# Patient Record
Sex: Female | Born: 1960 | Race: Black or African American | Hispanic: No | Marital: Married | Smoking: Never smoker
Health system: Southern US, Community
[De-identification: ages and names within clinical notes are randomized; demographics above are authoritative.]

## PROBLEM LIST (undated history)

## (undated) DIAGNOSIS — E559 Vitamin D deficiency, unspecified: Secondary | ICD-10-CM

## (undated) DIAGNOSIS — G459 Transient cerebral ischemic attack, unspecified: Secondary | ICD-10-CM

## (undated) DIAGNOSIS — D649 Anemia, unspecified: Secondary | ICD-10-CM

## (undated) DIAGNOSIS — E782 Mixed hyperlipidemia: Secondary | ICD-10-CM

## (undated) HISTORY — PX: OTHER SURGICAL HISTORY: SHX169

## (undated) HISTORY — DX: Anemia, unspecified: D64.9

## (undated) HISTORY — DX: Mixed hyperlipidemia: E78.2

## (undated) HISTORY — DX: Vitamin D deficiency, unspecified: E55.9

## (undated) HISTORY — DX: Transient cerebral ischemic attack, unspecified: G45.9

---

## 2001-04-21 ENCOUNTER — Emergency Department (HOSPITAL_COMMUNITY): Admission: EM | Admit: 2001-04-21 | Discharge: 2001-04-22 | Payer: Self-pay | Admitting: *Deleted

## 2001-04-22 ENCOUNTER — Encounter: Payer: Self-pay | Admitting: Emergency Medicine

## 2012-07-17 ENCOUNTER — Other Ambulatory Visit (HOSPITAL_COMMUNITY)
Admission: RE | Admit: 2012-07-17 | Discharge: 2012-07-17 | Disposition: A | Discharge status: Home / Self Care | Payer: Managed Care, Other (non HMO) | Source: Ambulatory Visit | Attending: Family Medicine | Admitting: Family Medicine

## 2012-07-17 DIAGNOSIS — Z0000 Encounter for general adult medical examination without abnormal findings: Secondary | ICD-10-CM | POA: Insufficient documentation

## 2013-05-30 ENCOUNTER — Encounter (HOSPITAL_COMMUNITY): Payer: Self-pay | Admitting: Emergency Medicine

## 2013-05-30 ENCOUNTER — Inpatient Hospital Stay (HOSPITAL_COMMUNITY)
Admission: EM | Admit: 2013-05-30 | Discharge: 2013-06-01 | DRG: 069 | Disposition: A | Discharge status: Home / Self Care | Payer: Managed Care, Other (non HMO) | Attending: Internal Medicine | Admitting: Internal Medicine

## 2013-05-30 ENCOUNTER — Emergency Department (HOSPITAL_COMMUNITY): Payer: Managed Care, Other (non HMO)

## 2013-05-30 DIAGNOSIS — E7800 Pure hypercholesterolemia, unspecified: Secondary | ICD-10-CM | POA: Diagnosis present

## 2013-05-30 DIAGNOSIS — G459 Transient cerebral ischemic attack, unspecified: Principal | ICD-10-CM | POA: Diagnosis present

## 2013-05-30 DIAGNOSIS — I671 Cerebral aneurysm, nonruptured: Secondary | ICD-10-CM | POA: Diagnosis present

## 2013-05-30 LAB — CBC WITH DIFFERENTIAL/PLATELET
Basophils Absolute: 0 10*3/uL (ref 0.0–0.1)
Basophils Relative: 0 % (ref 0–1)
Eosinophils Absolute: 0 10*3/uL (ref 0.0–0.7)
Eosinophils Relative: 0 % (ref 0–5)
HCT: 38.6 % (ref 36.0–46.0)
Hemoglobin: 13.5 g/dL (ref 12.0–15.0)
Lymphocytes Relative: 28 % (ref 12–46)
Lymphs Abs: 1.9 10*3/uL (ref 0.7–4.0)
MCH: 31.1 pg (ref 26.0–34.0)
MCHC: 35 g/dL (ref 30.0–36.0)
MCV: 88.9 fL (ref 78.0–100.0)
Monocytes Absolute: 0.3 10*3/uL (ref 0.1–1.0)
Monocytes Relative: 4 % (ref 3–12)
Neutro Abs: 4.5 10*3/uL (ref 1.7–7.7)
Neutrophils Relative %: 68 % (ref 43–77)
Platelets: 307 10*3/uL (ref 150–400)
RBC: 4.34 MIL/uL (ref 3.87–5.11)
RDW: 12.5 % (ref 11.5–15.5)
WBC: 6.7 10*3/uL (ref 4.0–10.5)

## 2013-05-30 LAB — BASIC METABOLIC PANEL
BUN: 6 mg/dL (ref 6–23)
CO2: 29 mEq/L (ref 19–32)
Calcium: 9.6 mg/dL (ref 8.4–10.5)
Chloride: 101 mEq/L (ref 96–112)
Creatinine, Ser: 0.99 mg/dL (ref 0.50–1.10)
GFR calc Af Amer: 76 mL/min — ABNORMAL LOW (ref >90)
GFR calc non Af Amer: 65 mL/min — ABNORMAL LOW (ref >90)
Glucose, Bld: 98 mg/dL (ref 70–99)
Potassium: 4 mEq/L (ref 3.5–5.1)
Sodium: 138 mEq/L (ref 135–145)

## 2013-05-30 MED ORDER — SODIUM CHLORIDE 0.9 % IV SOLN
INTRAVENOUS | Status: AC
  Administered 2013-05-30: 1000 mL via INTRAVENOUS

## 2013-05-30 NOTE — ED Notes (Signed)
Triage note from 1540 on wrong patient.

## 2013-05-30 NOTE — H&P (Addendum)
[facility] History and Physical  Judy Hudson ZOX:[id number] DOB: 1961-07-10 DOA: 05/30/2013  Referring physician: Dr Adriana Simas (ED) PCP: Leota Sauers, RN  Specialists: neurology (Dr Thana Farr)  Chief Complaint:Lt facial tingling/Tingling Lt arm   HPI: 52 y.o. BF negative past medical history states went to bed last night at 2300 and felt "normal" and then awoke at 0600 on 05/30/2013 with left facial tingling. As the day progressed she felt heaviness and tingling in her left arm as well. Her upper extremity symptoms only lasted for about an hour but her face continued to tingle. . She called her primary MD at [facility] (Dr Elsie Saas) who saw her at 10 and then directed her to ED. states upon arrival at ED symptoms have resolved. Patient was evaluated by neurology (Dr Thana Farr) and Dr. Haywood Filler physician) for patient should be admitted for TIA versus CVA. Currently patient has zero neurological deficits. Patient has good family support with numerous family members at patient's side.  Review of Systems: The patient denies anorexia, fever, weight loss,, vision loss, decreased hearing, hoarseness, chest pain, syncope, dyspnea on exertion, peripheral edema, balance deficits, hemoptysis, abdominal pain, melena, hematochezia, severe indigestion/heartburn, hematuria, incontinence, genital sores, muscle weakness, suspicious skin lesions, transient blindness, difficulty walking, depression, unusual weight change, abnormal bleeding, enlarged lymph nodes, angioedema, and breast masses.    History reviewed. No pertinent past medical history.  Social History:  reports that she does not drink alcohol or use illicit drugs. Negative previous tobacco history. .  where does patient live; at home with husband Patient Can patient participate in ADLs?  Allergies  Allergen Reactions  . Codeine Nausea And Vomiting  . Ibuprofen Other (See Comments)    "turns her stomach"    No family history on  file.  Mother passed away from pancreatic cancer. Dad and 4x siblings HTN, Dad HLD      Prior to Admission medications   Not on File   Physical Exam: Filed Vitals:   05/30/13 1900 05/30/13 1915 05/30/13 1930 05/30/13 1945  BP: 123/79 130/87 134/71 113/63  Pulse: 54 66 56 60  Temp:      TempSrc:      Resp:      SpO2: 100% 100% 99% 100%     General: Alert and oriented x4  Eyes: Pupils are equal bilaterally and respond appropriately to light.  Cardiovascular: RRR, (-) M/R/G, DP/PT pulse +2  Respiratory:CTA Bilat Neurologic: Cranial nerves II through XII are intact. NOTE; patient passed a swallow test in ED. Patient's sensation is normal in both upper trapezius and lower extremities 2 point discrimination, pain. Negative Romberg's. Strength in upper and lower extremity 5/5. Patellar reflexes 2+. Patient's gait is normal on toe walk, Heel walk, balancing on one leg within normal limits.  Labs on Admission:  Basic Metabolic Panel:  Recent Labs Lab 05/30/13 1233  NA 138  K 4.0  CL 101  CO2 29  GLUCOSE 98  BUN 6  CREATININE 0.99  CALCIUM 9.6   Liver Function Tests: No results found for this basename: AST, ALT, ALKPHOS, BILITOT, PROT, ALBUMIN,  in the last 168 hours No results found for this basename: LIPASE, AMYLASE,  in the last 168 hours No results found for this basename: AMMONIA,  in the last 168 hours CBC:  Recent Labs Lab 05/30/13 1233  WBC 6.7  NEUTROABS 4.5  HGB 13.5  HCT 38.6  MCV 88.9  PLT 307   Cardiac Enzymes: No results found for this basename: CKTOTAL, CKMB, CKMBINDEX,  TROPONINI,  in the last 168 hours  BNP (last 3 results) No results found for this basename: PROBNP,  in the last 8760 hours CBG: No results found for this basename: GLUCAP,  in the last 168 hours  Radiological Exams on Admission: Ct Head Wo Contrast  05/30/2013   *RADIOLOGY REPORT*  Clinical Data: Left facial numbness.  CT HEAD WITHOUT CONTRAST  Technique:  Contiguous axial  images were obtained from the base of the skull through the vertex without contrast.  Comparison: None.  Findings: The brain appears normal without infarct, hemorrhage, mass lesion, mass effect, midline shift or abnormal extra-axial fluid collection.  No hydrocephalus or pneumocephalus.  The calvarium is intact.  IMPRESSION: Negative exam.   Original Report Authenticated By: Holley Dexter, M.D.    EKG: Independently reviewed. Read Pending Assessment/Plan Active Problems:   TIA   1. TIA; ABCD2 Score=2; counsel patient and family given her symptoms the most likely etiology of the symptoms was a TIA. However we would complete the TIA protocol over the next 24 hours to assure that she did not have a small CVA (will obtain MRA, MRI of brain without contrast, cardiac echo, carotid Dopplers, evaluate for hyperlipidemia, diabetes). Per neurology's note Will start patient on aspirin 81 mg. 2. DVT prophylaxis; patient encouraged to ambulate as tolerated, and use SCDs when in the bed until cleared by MRI.   Code Status: Full  Family Communication: Present during exam were patient's husband, sisters, father.  Disposition Plan: At multidisciplinary rounds on 19 June will begin discharge planning expecting that patient's studies will all be negative.  Time spent 60 minutes  Drema Dallas [facility] Pager [phone]

## 2013-05-30 NOTE — Consult Note (Addendum)
Referring Physician: Dr. Joseph Art    Chief Complaint: left face tingling  HPI: Judy Hudson is an 52 y.o. female who went to bed last night at 2300 and felt "normal" and then awoke at 0600 on 05/30/2013 with left facial tingling. As the day progressed she felt heaviness and tingling in her left arm as well.  Her upper extremity symptoms only lasted for about an hour but her face has continued to tingle.  .  She called her primary MD at [facility] who saw her at 2 and then directed her to ED.  See's MD yearly. Healthy. Exercises. No history of clotting disorder. Post-menopausal but not on HRT.  Date last known well: Date: 05/29/2013 Time last known well: Time: 23:00  tPA Given: No: out of window, symptoms resolving  PMH: post menopausal x 2 years (no HRT) No pertinent past surgical history.  FH: Mother passed away from pancreatic cancer.  Her father and siblings have hypertension  Social History:  Denies tobacco, alcohol or illicit drug use.  Allergies:  Allergies  Allergen Reactions  . Codeine Nausea And Vomiting  . Ibuprofen Other (See Comments)    "turns her stomach"   Medications: None  ROS: History obtained from spouse and chart review  General ROS: negative for - chills, fatigue, fever, weight gain or weight loss,  +night sweats she attributes to menopause Psychological ROS: negative for - behavioral disorder, hallucinations, memory difficulties, mood swings or suicidal ideation Ophthalmic ROS: negative for - blurry vision, double vision, eye pain or loss of vision ENT ROS: negative for - epistaxis, nasal discharge, oral lesions, sore throat, tinnitus or vertigo Allergy and Immunology ROS: negative for - hives or itchy/watery eyes Hematological and Lymphatic ROS: negative for - bleeding problems, bruising or swollen lymph nodes Endocrine ROS: negative for - galactorrhea, hair pattern changes, polydipsia/polyuria or temperature intolerance Respiratory ROS: negative for - cough,  hemoptysis, shortness of breath or wheezing Cardiovascular ROS: negative for - chest pain, dyspnea on exertion, edema or irregular heartbeat Gastrointestinal ROS: negative for - abdominal pain, diarrhea, hematemesis, nausea/vomiting or stool incontinence Genito-Urinary ROS: negative for - dysuria, hematuria, incontinence or urinary frequency/urgency Musculoskeletal ROS: negative for - joint swelling or muscular weakness Neurological ROS: as noted in HPI Dermatological ROS: negative for rash and skin lesion changes   Physical Examination: Blood pressure 127/74, pulse 63, temperature 97.8 F (36.6 C), temperature source Oral, resp. rate 16, SpO2 98.00%.  Neurologic Examination: Mental Status: Alert, oriented, thought content appropriate.  Speech fluent without evidence of aphasia.  Able to follow 3 step commands without difficulty. Cranial Nerves: II: Discs flat bilaterally; visual fields grossly normal, pupils equal, round, reactive to light and accommodation III,IV, VI: ptosis not present, extra-ocular motions intact bilaterally V,VII: smile symmetric, facial light touch sensation normal bilaterally despite continuing to feel a tingling sensation on left cheek VIII: hearing normal bilaterally IX,X: gag reflex present XI: trapezius strength/neck flexion strength normal bilaterally XII: tongue strength normal  Motor: Right : Upper extremity   5/5    Left:     Upper extremity   5/5  Lower extremity   5/5     Lower extremity   5/5 Tone and bulk:normal tone throughout; no atrophy noted Sensory: Pinprick and light touch intact throughout, bilaterally Deep Tendon Reflexes: 2+ and symmetric with absent AJ's bilaterally Plantars: Right: downgoing   Left: downgoing Cerebellar: normal finger-to-nose, normal heel-to-shin test CV: Pulses palpable throughout  Results for orders placed during the hospital encounter of 05/30/13 (from the past  48 hour(s))  BASIC METABOLIC PANEL     Status:  Abnormal   Collection Time    05/30/13 12:33 PM      Result Value Range   Sodium 138  135 - 145 mEq/L   Potassium 4.0  3.5 - 5.1 mEq/L   Chloride 101  96 - 112 mEq/L   CO2 29  19 - 32 mEq/L   Glucose, Bld 98  70 - 99 mg/dL   BUN 6  6 - 23 mg/dL   Creatinine, Ser 1.61  0.50 - 1.10 mg/dL   Calcium 9.6  8.4 - 09.6 mg/dL   GFR calc non Af Amer 65 (*) >90 mL/min   GFR calc Af Amer 76 (*) >90 mL/min   Comment:            The eGFR has been calculated     using the CKD EPI equation.     This calculation has not been     validated in all clinical     situations.     eGFR's persistently     <90 mL/min signify     possible Chronic Kidney Disease.  CBC WITH DIFFERENTIAL     Status: None   Collection Time    05/30/13 12:33 PM      Result Value Range   WBC 6.7  4.0 - 10.5 K/uL   RBC 4.34  3.87 - 5.11 MIL/uL   Hemoglobin 13.5  12.0 - 15.0 g/dL   HCT 04.5  40.9 - 81.1 %   MCV 88.9  78.0 - 100.0 fL   MCH 31.1  26.0 - 34.0 pg   MCHC 35.0  30.0 - 36.0 g/dL   RDW 91.4  78.2 - 95.6 %   Platelets 307  150 - 400 K/uL   Neutrophils Relative % 68  43 - 77 %   Neutro Abs 4.5  1.7 - 7.7 K/uL   Lymphocytes Relative 28  12 - 46 %   Lymphs Abs 1.9  0.7 - 4.0 K/uL   Monocytes Relative 4  3 - 12 %   Monocytes Absolute 0.3  0.1 - 1.0 K/uL   Eosinophils Relative 0  0 - 5 %   Eosinophils Absolute 0.0  0.0 - 0.7 K/uL   Basophils Relative 0  0 - 1 %   Basophils Absolute 0.0  0.0 - 0.1 K/uL   Ct Head Wo Contrast  05/30/2013   *RADIOLOGY REPORT*  Clinical Data: Left facial numbness.  CT HEAD WITHOUT CONTRAST  Technique:  Contiguous axial images were obtained from the base of the skull through the vertex without contrast.  Comparison: None.  Findings: The brain appears normal without infarct, hemorrhage, mass lesion, mass effect, midline shift or abnormal extra-axial fluid collection.  No hydrocephalus or pneumocephalus.  The calvarium is intact.  IMPRESSION: Negative exam.   Original Report Authenticated  By: Holley Dexter, M.D.    Job Founds, MBA, MHA [facility] Pager [phone]  Patient seen and examined.  Clinical course and management discussed.  Necessary edits performed.  I agree with the above.  Assessment and plan of care developed and discussed below.    Assessment: 52 year old female with minimal risk factors who presents with left sided complaints.  They have improved to the point that only the left face is involved.  There is no facial droop, visual complaint or change in taste or swallowing.  Head CT has been performed and reviewed.  It shows no acute changes.  Further work up recommended.  Plan: 1. HgbA1c, fasting lipid panel 2. MRI, MRA  of the brain without contrast 3. Echocardiogram 4. Carotid dopplers 5. Prophylactic therapy-Antiplatelet med: Aspirin - dose 81mg  daily 6. Risk factor modification 7. Telemetry monitoring 8. Frequent neuro checks  Thana Farr, MD [facility] [phone]  05/30/2013  6:32 PM

## 2013-05-30 NOTE — ED Notes (Signed)
Pt. Stated, I woke up this morning with my face tingling and my left arm feels weak.  Went to Dr Lorin Picket and told to come here for a CT scan.  Pt.has bilateral = grips, smile was symmetrical. Pt. Went to bed and was ok, and I woke up and this is what was going on.Marland Kitchen

## 2013-05-30 NOTE — ED Provider Notes (Signed)
History     CSN: [id number]  Arrival date & time 05/30/13  1128   First MD Initiated Contact with Patient 05/30/13 1147      Chief Complaint  Patient presents with  . Weakness    (Consider location/radiation/quality/duration/timing/severity/associated sxs/prior treatment) HPI.... left arm and left cheek numbness upon awakening this morning. Patient was normal when she went to bed last night. No previous neurological events. Patient is normally healthy. She takes no medications at home. No smoking.  Nothing makes symptoms better or worse. Severity is mild.  History reviewed. No pertinent past medical history.  History reviewed. No pertinent past surgical history.  No family history on file.  History  Substance Use Topics  . Smoking status: Not on file  . Smokeless tobacco: Not on file  . Alcohol Use: No    OB History   Grav Para Term Preterm Abortions TAB SAB Ect Mult Living                  Review of Systems  All other systems reviewed and are negative.    Allergies  Codeine and Ibuprofen  Home Medications  No current outpatient prescriptions on file.  BP 135/77  Pulse 67  Temp(Src) 97.8 F (36.6 C) (Oral)  Resp 16  SpO2 100%  Physical Exam  Nursing note and vitals reviewed. Constitutional: She is oriented to person, place, and time. She appears well-developed and well-nourished.  HENT:  Head: Normocephalic and atraumatic.  Eyes: Conjunctivae and EOM are normal. Pupils are equal, round, and reactive to light.  Neck: Normal range of motion. Neck supple.  Cardiovascular: Normal rate, regular rhythm and normal heart sounds.   Pulmonary/Chest: Effort normal and breath sounds normal.  Abdominal: Soft. Bowel sounds are normal.  Musculoskeletal: Normal range of motion.  Neurological: She is alert and oriented to person, place, and time.  Full range of motion of left arm.  Skin: Skin is warm and dry.  Psychiatric: She has a normal mood and affect.    ED  Course  Procedures (including critical care time)  Labs Reviewed  BASIC METABOLIC PANEL - Abnormal; Notable for the following:    GFR calc non Af Amer 65 (*)    GFR calc Af Amer 76 (*)    All other components within normal limits  CBC WITH DIFFERENTIAL   No results found. Ct Head Wo Contrast  05/30/2013   *RADIOLOGY REPORT*  Clinical Data: Left facial numbness.  CT HEAD WITHOUT CONTRAST  Technique:  Contiguous axial images were obtained from the base of the skull through the vertex without contrast.  Comparison: None.  Findings: The brain appears normal without infarct, hemorrhage, mass lesion, mass effect, midline shift or abnormal extra-axial fluid collection.  No hydrocephalus or pneumocephalus.  The calvarium is intact.  IMPRESSION: Negative exam.   Original Report Authenticated By: Holley Dexter, M.D.    No diagnosis found.   Date: 05/30/2013  Rate: 5  Rhythm: Sinus bradycardia  QRS Axis: normal  Intervals: normal  ST/T Wave abnormalities: normal  Conduction Disutrbances: none  Narrative Interpretation: unremarkable     MDM  CT head normal.  This could represent a TIA. Discussed with neurology. Admit to general medicine.        Donnetta Hutching, MD 05/30/13 [phone]

## 2013-05-31 ENCOUNTER — Inpatient Hospital Stay (HOSPITAL_COMMUNITY): Payer: Managed Care, Other (non HMO)

## 2013-05-31 ENCOUNTER — Encounter (HOSPITAL_COMMUNITY): Payer: Self-pay | Admitting: General Practice

## 2013-05-31 DIAGNOSIS — G459 Transient cerebral ischemic attack, unspecified: Secondary | ICD-10-CM

## 2013-05-31 DIAGNOSIS — I671 Cerebral aneurysm, nonruptured: Secondary | ICD-10-CM | POA: Diagnosis present

## 2013-05-31 LAB — RAPID URINE DRUG SCREEN, HOSP PERFORMED
Amphetamines: NOT DETECTED
Barbiturates: NOT DETECTED
Benzodiazepines: NOT DETECTED
Cocaine: NOT DETECTED
Tetrahydrocannabinol: NOT DETECTED

## 2013-05-31 LAB — GLUCOSE, CAPILLARY: Glucose-Capillary: 86 mg/dL (ref 70–99)

## 2013-05-31 LAB — LIPID PANEL
Cholesterol: 204 mg/dL — ABNORMAL HIGH (ref 0–200)
HDL: 77 mg/dL (ref >39)
LDL Cholesterol: 114 mg/dL — ABNORMAL HIGH (ref 0–99)
Total CHOL/HDL Ratio: 2.6 RATIO
Triglycerides: 65 mg/dL (ref <150)
VLDL: 13 mg/dL (ref 0–40)

## 2013-05-31 MED ORDER — IOHEXOL 350 MG/ML SOLN
50.0000 mL | Freq: Once | INTRAVENOUS | Status: AC | PRN
  Administered 2013-05-31: 50 mL via INTRAVENOUS

## 2013-05-31 NOTE — Progress Notes (Signed)
*  PRELIMINARY RESULTS* Vascular Ultrasound Carotid Duplex (Doppler) has been completed.  Preliminary findings: Bilateral:  Less than 39% ICA stenosis.  Vertebral artery flow is antegrade.    Farrel Demark, RDMS, RVT  05/31/2013, 10:53 AM

## 2013-05-31 NOTE — Plan of Care (Signed)
Dr Joseph Art paged in regards to pts discharge. Waiting for md to call back.

## 2013-05-31 NOTE — Progress Notes (Signed)
Echo Lab  2D Echocardiogram completed.  Judy Hudson, RDCS 05/31/2013 11:06 AM

## 2013-05-31 NOTE — Progress Notes (Addendum)
Subjective: Patient reports that she awakened this morning and all of her symptoms had resolved.  She feels that she is at baseline.  MRI of the brain has been reviewed and shows no acute infarct.  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  Do not feel that the aneurysm contributed to the patient's symptoms.  Echo preformed and shows no evidence of intracardiac masses or thrombi.    Objective: Current vital signs: BP 110/66  Pulse 54  Temp(Src) 97.9 F (36.6 C) (Oral)  Resp 20  Ht 4\' 11"  (1.499 m)  Wt 63.6 kg (140 lb 3.4 oz)  BMI 28.3 kg/m2  SpO2 97% Vital signs in last 24 hours: Temp:  [97.9 F (36.6 C)-98 F (36.7 C)] 97.9 F (36.6 C) (06/19 0629) Pulse Rate:  [54-136] 54 (06/19 0629) Resp:  [19-20] 20 (06/19 0629) BP: (97-134)/(44-89) 110/66 mmHg (06/19 0629) SpO2:  [97 %-100 %] 97 % (06/19 0629) Weight:  [63.6 kg (140 lb 3.4 oz)] 63.6 kg (140 lb 3.4 oz) (06/18 2120)  Intake/Output from previous day:   Intake/Output this shift: Total I/O In: 240 [P.O.:240] Out: -  Nutritional status: Cardiac  Neurologic Exam: Mental Status:  Alert, oriented, thought content appropriate. Speech fluent without evidence of aphasia. Able to follow 3 step commands without difficulty.  Cranial Nerves:  II: Discs flat bilaterally; visual fields grossly normal, pupils equal, round, reactive to light and accommodation  III,IV, VI: ptosis not present, extra-ocular motions intact bilaterally  V,VII: smile symmetric, facial light touch sensation intact bilaterally VIII: hearing normal bilaterally  IX,X: gag reflex present  XI: trapezius strength/neck flexion strength normal bilaterally  XII: tongue strength normal  Motor:  Right : Upper extremity 5/5          Left: Upper extremity 5/5   Lower extremity 5/5       Lower extremity 5/5  Tone and bulk:normal tone throughout; no atrophy noted  Sensory: Pinprick and light touch intact throughout, bilaterally  Deep Tendon Reflexes: 2+ and symmetric with  absent AJ's bilaterally  Plantars:  Right: downgoing   Left: downgoing   Lab Results: Basic Metabolic Panel:  Recent Labs Lab 05/30/13 1233  NA 138  K 4.0  CL 101  CO2 29  GLUCOSE 98  BUN 6  CREATININE 0.99  CALCIUM 9.6    Liver Function Tests: No results found for this basename: AST, ALT, ALKPHOS, BILITOT, PROT, ALBUMIN,  in the last 168 hours No results found for this basename: LIPASE, AMYLASE,  in the last 168 hours No results found for this basename: AMMONIA,  in the last 168 hours  CBC:  Recent Labs Lab 05/30/13 1233  WBC 6.7  NEUTROABS 4.5  HGB 13.5  HCT 38.6  MCV 88.9  PLT 307    Cardiac Enzymes: No results found for this basename: CKTOTAL, CKMB, CKMBINDEX, TROPONINI,  in the last 168 hours  Lipid Panel:  Recent Labs Lab 05/31/13 0525  CHOL 204*  TRIG 65  HDL 77  CHOLHDL 2.6  VLDL 13  LDLCALC 696*    CBG:  Recent Labs Lab 05/31/13 0149 05/31/13 0630  GLUCAP 86 87    Microbiology: No results found for this or any previous visit.  Coagulation Studies: No results found for this basename: LABPROT, INR,  in the last 72 hours  Imaging: Ct Head Wo Contrast  05/30/2013   *RADIOLOGY REPORT*  Clinical Data: Left facial numbness.  CT HEAD WITHOUT CONTRAST  Technique:  Contiguous axial images were obtained from the  base of the skull through the vertex without contrast.  Comparison: None.  Findings: The brain appears normal without infarct, hemorrhage, mass lesion, mass effect, midline shift or abnormal extra-axial fluid collection.  No hydrocephalus or pneumocephalus.  The calvarium is intact.  IMPRESSION: Negative exam.   Original Report Authenticated By: Holley Dexter, M.D.   Mri Brain Without Contrast  05/31/2013   *RADIOLOGY REPORT*  Clinical Data:  Left facial tingling.  Heaviness and tingling left arm.  Left arm symptoms have cleared.  MRI BRAIN WITHOUT CONTRAST MRA HEAD WITHOUT CONTRAST  Technique: Multiplanar, multiecho pulse sequences  of the brain and surrounding structures were obtained according to standard protocol without intravenous contrast.  Angiographic images of the head were obtained using MRA technique without contrast.  Comparison: 05/30/2013 CT.  No comparison MR.  MRI HEAD  Findings:  No acute infarct.  Few scattered punctate nonspecific white matter type changes greater on the left.  Similar type findings described in patients with migraine headaches.  Other considerations include that secondary to; small vessel disease, vasculitis, inflammatory process or prior trauma.  Appearance is not typical for result of demyelinating process.  No hydrocephalus.  No intracranial hemorrhage.  Major intracranial vascular structures are patent.  No intracranial mass lesion detected on this unenhanced exam.  Mild cervical spondylotic changes C3-4 and C4-5 incompletely assessed on the present examination.  Cervical medullary junction, pituitary region, pineal region and orbital structures unremarkable.  IMPRESSION: No acute infarct.  Minimal nonspecific white matter type changes as noted above.  MRA HEAD  Findings: Question 2.2 mm aneurysm M1 segment left middle cerebral artery.  This may represent origin of a vessel.  Anterior circulation without medium or large size vessel significant stenosis or occlusion.  Ectatic vertebral arteries and the basilar artery.  Left vertebral artery slightly dominant in size.  No high-grade stenosis of the vertebral arteries or basilar artery.  Poor delineation of the AICAs.  Mild irregularity of the superior cerebellar artery and distal branches of the posterior cerebral artery bilaterally.  IMPRESSION: Question 2.2 mm left middle cerebral artery M1 segment aneurysm as noted above.   Original Report Authenticated By: Lacy Duverney, M.D.   Mr Mra Head/brain Wo Cm  05/31/2013   *RADIOLOGY REPORT*  Clinical Data:  Left facial tingling.  Heaviness and tingling left arm.  Left arm symptoms have cleared.  MRI BRAIN  WITHOUT CONTRAST MRA HEAD WITHOUT CONTRAST  Technique: Multiplanar, multiecho pulse sequences of the brain and surrounding structures were obtained according to standard protocol without intravenous contrast.  Angiographic images of the head were obtained using MRA technique without contrast.  Comparison: 05/30/2013 CT.  No comparison MR.  MRI HEAD  Findings:  No acute infarct.  Few scattered punctate nonspecific white matter type changes greater on the left.  Similar type findings described in patients with migraine headaches.  Other considerations include that secondary to; small vessel disease, vasculitis, inflammatory process or prior trauma.  Appearance is not typical for result of demyelinating process.  No hydrocephalus.  No intracranial hemorrhage.  Major intracranial vascular structures are patent.  No intracranial mass lesion detected on this unenhanced exam.  Mild cervical spondylotic changes C3-4 and C4-5 incompletely assessed on the present examination.  Cervical medullary junction, pituitary region, pineal region and orbital structures unremarkable.  IMPRESSION: No acute infarct.  Minimal nonspecific white matter type changes as noted above.  MRA HEAD  Findings: Question 2.2 mm aneurysm M1 segment left middle cerebral artery.  This may represent origin of a  vessel.  Anterior circulation without medium or large size vessel significant stenosis or occlusion.  Ectatic vertebral arteries and the basilar artery.  Left vertebral artery slightly dominant in size.  No high-grade stenosis of the vertebral arteries or basilar artery.  Poor delineation of the AICAs.  Mild irregularity of the superior cerebellar artery and distal branches of the posterior cerebral artery bilaterally.  IMPRESSION: Question 2.2 mm left middle cerebral artery M1 segment aneurysm as noted above.   Original Report Authenticated By: Lacy Duverney, M.D.    Medications:  None   Assessment/Plan: 53 year old female with onset of left  sided symptoms.  Symptoms have resolved.  MRI unremarkable.  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  Echocardiogram unremarkable.  A1c 5.6, LDL114.  Recommendations: 1.  ASA 81mg  daily 2.  Lipid management 3.  Aneurysm to be followed up on an outpatient basis 4.  No further neurologic intervention is recommended at this time.  If further questions arise, please call or page at that time.  Thank you for allowing neurology to participate in the care of this patient.    LOS: 1 day   Thana Farr, MD [facility] [phone] 05/31/2013  12:51 PM

## 2013-05-31 NOTE — Progress Notes (Signed)
Utilization review completed. Ayslin Kundert, RN, BSN. 

## 2013-05-31 NOTE — Progress Notes (Signed)
[facility] PROGRESS NOTE  Judy Hudson UJW:[id number] DOB: 04-Nov-1961 DOA: 05/30/2013 PCP: Leota Sauers, RN  Assessment/Plan: 1. TIA; all SSx resolved 2,   Aneurysm left middle cerebral artery; during TIA workup MRA showed questionable 2.2 mm aneurysm M1 segment left middle cerebral artery. Discussed findings with Dr. Constance Goltz (radiologist) decided on brain CTA as definitive test to rule out/rule in aneurysm. Plan discussed with patient husband, and son.   TIA (transient ischemic attack) Aneurysm left middle cerebral artery  Code Status:Full Family Communication: Discussed treatment plan with patient's husband and son Disposition Plan: Will potentially discharge following CTA. All TIA signs and symptoms resolved  Consultants:  Neurology  Procedures:, Brain MRI/MRA, cardiac echo, carotid Dopplers Preliminary findings: Bilateral: Less than 39% ICA stenosis. Vertebral artery flow is antegrade, brain CTA pending  Antibiotics:  None  HPI/Subjective: 52 y.o. BF negative past medical history states went to bed last night at 2300 and felt "normal" and then awoke at 0600 on 05/30/2013 with left facial tingling. As the day progressed she felt heaviness and tingling in her left arm as well. Her upper extremity symptoms only lasted for about an hour but her face continued to tingle.  She called her primary MD at [facility] (Dr Elsie Saas) who saw her at 70 and then directed her to ED. states upon arrival at ED symptoms have resolved. Patient was evaluated by neurology (Dr Thana Farr) and Dr. Haywood Filler physician) for patient should be admitted for TIA versus CVA. TODAYzero neurological deficits. Patient and family anxious for discharge.  Objective: Filed Vitals:   05/31/13 0143 05/31/13 0149 05/31/13 0629 05/31/13 1300  BP: 105/58 110/74 110/66 117/70  Pulse: 63  54 55  Temp: 97.9 F (36.6 C)  97.9 F (36.6 C) 97.6 F (36.4 C)  TempSrc: Oral  Oral Oral  Resp: 19  20 20   Height:       Weight:      SpO2: 97%  97% 96%    Intake/Output Summary (Last 24 hours) at 05/31/13 1643 Last data filed at 05/31/13 1300  Gross per 24 hour  Intake    440 ml  Output      1 ml  Net    439 ml   Filed Weights   05/30/13 2120  Weight: 63.6 kg (140 lb 3.4 oz)    Exam:  General: Alert and oriented x4  Eyes: Pupils are equal bilaterally and respond appropriately to light.  Cardiovascular: RRR, (-) M/R/G, DP/PT pulse +2  Respiratory:CTA Bilat     Data Reviewed: Basic Metabolic Panel:  Recent Labs Lab 05/30/13 1233  NA 138  K 4.0  CL 101  CO2 29  GLUCOSE 98  BUN 6  CREATININE 0.99  CALCIUM 9.6   Liver Function Tests: No results found for this basename: AST, ALT, ALKPHOS, BILITOT, PROT, ALBUMIN,  in the last 168 hours No results found for this basename: LIPASE, AMYLASE,  in the last 168 hours No results found for this basename: AMMONIA,  in the last 168 hours CBC:  Recent Labs Lab 05/30/13 1233  WBC 6.7  NEUTROABS 4.5  HGB 13.5  HCT 38.6  MCV 88.9  PLT 307   Cardiac Enzymes: No results found for this basename: CKTOTAL, CKMB, CKMBINDEX, TROPONINI,  in the last 168 hours BNP (last 3 results) No results found for this basename: PROBNP,  in the last 8760 hours CBG:  Recent Labs Lab 05/31/13 0149 05/31/13 0630  GLUCAP 86 87    No results found for this or  any previous visit (from the past 240 hour(s)).   Studies: Ct Head Wo Contrast  05/30/2013   *RADIOLOGY REPORT*  Clinical Data: Left facial numbness.  CT HEAD WITHOUT CONTRAST  Technique:  Contiguous axial images were obtained from the base of the skull through the vertex without contrast.  Comparison: None.  Findings: The brain appears normal without infarct, hemorrhage, mass lesion, mass effect, midline shift or abnormal extra-axial fluid collection.  No hydrocephalus or pneumocephalus.  The calvarium is intact.  IMPRESSION: Negative exam.   Original Report Authenticated By: Holley Dexter, M.D.    Mri Brain Without Contrast  05/31/2013   *RADIOLOGY REPORT*  Clinical Data:  Left facial tingling.  Heaviness and tingling left arm.  Left arm symptoms have cleared.  MRI BRAIN WITHOUT CONTRAST MRA HEAD WITHOUT CONTRAST  Technique: Multiplanar, multiecho pulse sequences of the brain and surrounding structures were obtained according to standard protocol without intravenous contrast.  Angiographic images of the head were obtained using MRA technique without contrast.  Comparison: 05/30/2013 CT.  No comparison MR.  MRI HEAD  Findings:  No acute infarct.  Few scattered punctate nonspecific white matter type changes greater on the left.  Similar type findings described in patients with migraine headaches.  Other considerations include that secondary to; small vessel disease, vasculitis, inflammatory process or prior trauma.  Appearance is not typical for result of demyelinating process.  No hydrocephalus.  No intracranial hemorrhage.  Major intracranial vascular structures are patent.  No intracranial mass lesion detected on this unenhanced exam.  Mild cervical spondylotic changes C3-4 and C4-5 incompletely assessed on the present examination.  Cervical medullary junction, pituitary region, pineal region and orbital structures unremarkable.  IMPRESSION: No acute infarct.  Minimal nonspecific white matter type changes as noted above.  MRA HEAD  Findings: Question 2.2 mm aneurysm M1 segment left middle cerebral artery.  This may represent origin of a vessel.  Anterior circulation without medium or large size vessel significant stenosis or occlusion.  Ectatic vertebral arteries and the basilar artery.  Left vertebral artery slightly dominant in size.  No high-grade stenosis of the vertebral arteries or basilar artery.  Poor delineation of the AICAs.  Mild irregularity of the superior cerebellar artery and distal branches of the posterior cerebral artery bilaterally.  IMPRESSION: Question 2.2 mm left middle cerebral  artery M1 segment aneurysm as noted above.   Original Report Authenticated By: Lacy Duverney, M.D.   Mr Mra Head/brain Wo Cm  05/31/2013   *RADIOLOGY REPORT*  Clinical Data:  Left facial tingling.  Heaviness and tingling left arm.  Left arm symptoms have cleared.  MRI BRAIN WITHOUT CONTRAST MRA HEAD WITHOUT CONTRAST  Technique: Multiplanar, multiecho pulse sequences of the brain and surrounding structures were obtained according to standard protocol without intravenous contrast.  Angiographic images of the head were obtained using MRA technique without contrast.  Comparison: 05/30/2013 CT.  No comparison MR.  MRI HEAD  Findings:  No acute infarct.  Few scattered punctate nonspecific white matter type changes greater on the left.  Similar type findings described in patients with migraine headaches.  Other considerations include that secondary to; small vessel disease, vasculitis, inflammatory process or prior trauma.  Appearance is not typical for result of demyelinating process.  No hydrocephalus.  No intracranial hemorrhage.  Major intracranial vascular structures are patent.  No intracranial mass lesion detected on this unenhanced exam.  Mild cervical spondylotic changes C3-4 and C4-5 incompletely assessed on the present examination.  Cervical medullary junction, pituitary region, pineal  region and orbital structures unremarkable.  IMPRESSION: No acute infarct.  Minimal nonspecific white matter type changes as noted above.  MRA HEAD  Findings: Question 2.2 mm aneurysm M1 segment left middle cerebral artery.  This may represent origin of a vessel.  Anterior circulation without medium or large size vessel significant stenosis or occlusion.  Ectatic vertebral arteries and the basilar artery.  Left vertebral artery slightly dominant in size.  No high-grade stenosis of the vertebral arteries or basilar artery.  Poor delineation of the AICAs.  Mild irregularity of the superior cerebellar artery and distal branches of  the posterior cerebral artery bilaterally.  IMPRESSION: Question 2.2 mm left middle cerebral artery M1 segment aneurysm as noted above.   Original Report Authenticated By: Lacy Duverney, M.D.    Scheduled Meds: Continuous Infusions:  Principal Problem:   TIA (transient ischemic attack)    Time spent: 30 minutes    Alera Quevedo, J  [facility] Pager [phone]. If 7PM-7AM, please contact night-coverage at [email or web address], password [email or web address] 05/31/2013, 4:43 PM  LOS: 1 day

## 2013-06-01 MED ORDER — ATORVASTATIN CALCIUM 20 MG PO TABS: 20.0000 mg | ORAL_TABLET | Freq: Every day | ORAL | Status: DC

## 2013-06-01 MED ORDER — ASPIRIN EC 81 MG PO TBEC: 81.0000 mg | DELAYED_RELEASE_TABLET | Freq: Every day | ORAL | Status: AC

## 2013-06-01 NOTE — Discharge Summary (Signed)
Physician Discharge Summary  Judy Hudson ZOX:[id number] DOB: [birth year] DOA: 05/30/2013  PCP: Tally Joe, MD  Admit date: 05/30/2013 Discharge date: 06/01/2013  Time spent: 30 minutes  Recommendations for Outpatient Follow-up:  1. TIA; counseled patient and husband on diagnosis of TIA and plan of care. Patient will start 81 mg aspirin.  2. Hypercholesterolemia; counseled patient her LDL was mildly elevated, and given her diagnosis of TIA would start patient on low-dose statin (Lipitor). Patient and husband also counseled on importance of low fat diet and exercise.  3. cerebral aneurysm; counseled patient on findings of CTA which showed the following; 2 mm entity arising superiorly from the left MCA M1 segment is re-identified and I favor an infundibulum of the left anterior temporal artery rather than a small aneurysm. Given the small size of the lesion, imaging surveillance with MRA should suffice for followup; consider repeat head MRA in 6 - 12 months. Counseled to arrange followup MRI via her PCP.  Discharge Diagnoses:  Principal Problem:   TIA (transient ischemic attack) Active Problems:   Aneurysm, cerebral, nonruptured   Discharge Condition: Stable  Diet recommendation: Low-fat  Filed Weights   05/30/13 2120  Weight: 63.6 kg (140 lb 3.4 oz)    History of present illness:  52 y.o. BF negative past medical history on day of admission stated went to bed last night at 2300 and felt "normal" and then awoke at 0600 on 05/30/2013 with left facial tingling. As the day progressed she felt heaviness and tingling in her left arm as well. Her upper extremity symptoms only lasted for about an hour but her face continued to tingle. She called her primary MD at [facility] (Dr Azucena Cecil) who saw her at 59 and then directed her to ED. states upon arrival at ED symptoms have resolved. Patient was evaluated by neurology (Dr Thana Farr) and Dr. Haywood Filler physician) and Pt admitted for TIA versus CVA.  TODAYzero neurological deficits.  Hospital Course:  During patient's hospital stay she received a Brain CT and MRI which were both negative for acute CVA. Brain MRI subsequently showed a possible 2 mm aneurysm  left middle cerebral artery M1 segment. Per radiology recommendation patient then received a CTA which showed a 2 mm infundibulum of the left anterior temporal artery rather than a small aneurysm. Patient and husband within [place] on need for followup MRA 6-12 months from today.  Procedures:  None  Consultations:  Neurology, cardiology  Discharge Exam: Filed Vitals:   05/31/13 1809 05/31/13 2205 06/01/13 0617 06/01/13 1000  BP: 134/94 143/84 112/73 121/78  Pulse: 54 55 87 51  Temp: 97.9 F (36.6 C) 98.1 F (36.7 C) 97.9 F (36.6 C) 98.1 F (36.7 C)  TempSrc: Oral Oral Oral Oral  Resp: 18 16 18 18   Height:      Weight:      SpO2: 96% 99% 97% 100%    General: Alert and oriented x4  Eyes: Pupils are equal bilaterally and respond appropriately to light.  Cardiovascular: RRR, (-) M/R/G, DP/PT pulse +2  Respiratory:CTA Bilat   Discharge Instructions     Medication List; Aspirin 81 mg PO daily                              Lipitor 20 mg PO daily                        Allergies  Allergen Reactions  .  Codeine Nausea And Vomiting  . Ibuprofen Other (See Comments)    "turns her stomach"      The results of significant diagnostics from this hospitalization (including imaging, microbiology, ancillary and laboratory) are listed below for reference.    Significant Diagnostic Studies: Ct Angio Head W/cm &/or Wo Cm  06/01/2013   *RADIOLOGY REPORT*  Clinical Data:  52 year old female with left facial tingling, left extremity symptoms.  Possible left MCA M1 segment aneurysm on recent MRA.  CT ANGIOGRAPHY HEAD  Technique:  Multidetector CT imaging of the head was performed using the standard protocol during bolus administration of intravenous contrast.  Multiplanar  CT image reconstructions including MIPs were obtained to evaluate the vascular anatomy.  Contrast: 50mL OMNIPAQUE IOHEXOL 350 MG/ML SOLN  Comparison:  Intracranial MRA 05/31/2013. Head CT 05/30/2013.  Findings:  Stable and normal cerebral volume.  No midline shift, ventriculomegaly, mass effect, evidence of mass lesion, intracranial hemorrhage or evidence of cortically based acute infarction.  Gray-white matter differentiation is within normal limits throughout the brain.  No abnormal enhancement identified. No acute osseous abnormality identified.  Visualized paranasal sinuses and mastoids are clear.  Visualized orbits and scalp soft tissues are within normal limits.  Vascular Findings: Major intracranial venous structures appear to be normally enhancing.  Mildly dominant distal left vertebral artery.  No distal vertebral artery stenosis.  Normal PICA origins.  Patent vertebrobasilar junction.  No basilar stenosis.  SCA and PCA origins are normal. Diminutive left posterior communicating artery, the right is more diminutive or absent.  Bilateral PCA branches are within normal limits.  Negative distal cervical ICA.  Both ICA siphons are patent and within normal limits.  Normal ophthalmic and left posterior communicating artery origins.  Small right anterior choroidal artery origin infundibulum.  Normal carotid termini, MCA and ACA origins.  Anterior communicating artery is diminutive or absent.  Bilateral ACA branches are within normal limits.  Normal right MCA M1 segment. Normal right MCA branches.  Left MCA M1 segment is patent.  The finding along the superior mid left M1 segment is re-identified and on these images more resembles an infundibulum, probably of the left anterior temporal artery. Normal left MCA bifurcation.  Left MCA branches are within normal limits.   Review of the MIP images confirms the above findings.  IMPRESSION:  1.  2 mm entity arising superiorly from the left MCA M1 segment is re-identified  and I favor an infundibulum of the left anterior temporal artery rather than a small aneurysm. Given the small size of the lesion, imaging surveillance with MRA should suffice for followup; consider repeat head MRA in 6 - 12 months. 2.  Otherwise negative intracranial CT. 3.  Stable and normal CT appearance of the brain.   Original Report Authenticated By: Erskine Speed, M.D.   Ct Head Wo Contrast  05/30/2013   *RADIOLOGY REPORT*  Clinical Data: Left facial numbness.  CT HEAD WITHOUT CONTRAST  Technique:  Contiguous axial images were obtained from the base of the skull through the vertex without contrast.  Comparison: None.  Findings: The brain appears normal without infarct, hemorrhage, mass lesion, mass effect, midline shift or abnormal extra-axial fluid collection.  No hydrocephalus or pneumocephalus.  The calvarium is intact.  IMPRESSION: Negative exam.   Original Report Authenticated By: Holley Dexter, M.D.   Mri Brain Without Contrast  05/31/2013   *RADIOLOGY REPORT*  Clinical Data:  Left facial tingling.  Heaviness and tingling left arm.  Left arm symptoms have cleared.  MRI  BRAIN WITHOUT CONTRAST MRA HEAD WITHOUT CONTRAST  Technique: Multiplanar, multiecho pulse sequences of the brain and surrounding structures were obtained according to standard protocol without intravenous contrast.  Angiographic images of the head were obtained using MRA technique without contrast.  Comparison: 05/30/2013 CT.  No comparison MR.  MRI HEAD  Findings:  No acute infarct.  Few scattered punctate nonspecific white matter type changes greater on the left.  Similar type findings described in patients with migraine headaches.  Other considerations include that secondary to; small vessel disease, vasculitis, inflammatory process or prior trauma.  Appearance is not typical for result of demyelinating process.  No hydrocephalus.  No intracranial hemorrhage.  Major intracranial vascular structures are patent.  No intracranial  mass lesion detected on this unenhanced exam.  Mild cervical spondylotic changes C3-4 and C4-5 incompletely assessed on the present examination.  Cervical medullary junction, pituitary region, pineal region and orbital structures unremarkable.  IMPRESSION: No acute infarct.  Minimal nonspecific white matter type changes as noted above.  MRA HEAD  Findings: Question 2.2 mm aneurysm M1 segment left middle cerebral artery.  This may represent origin of a vessel.  Anterior circulation without medium or large size vessel significant stenosis or occlusion.  Ectatic vertebral arteries and the basilar artery.  Left vertebral artery slightly dominant in size.  No high-grade stenosis of the vertebral arteries or basilar artery.  Poor delineation of the AICAs.  Mild irregularity of the superior cerebellar artery and distal branches of the posterior cerebral artery bilaterally.  IMPRESSION: Question 2.2 mm left middle cerebral artery M1 segment aneurysm as noted above.   Original Report Authenticated By: Lacy Duverney, M.D.   Mr Mra Head/brain Wo Cm  05/31/2013   *RADIOLOGY REPORT*  Clinical Data:  Left facial tingling.  Heaviness and tingling left arm.  Left arm symptoms have cleared.  MRI BRAIN WITHOUT CONTRAST MRA HEAD WITHOUT CONTRAST  Technique: Multiplanar, multiecho pulse sequences of the brain and surrounding structures were obtained according to standard protocol without intravenous contrast.  Angiographic images of the head were obtained using MRA technique without contrast.  Comparison: 05/30/2013 CT.  No comparison MR.  MRI HEAD  Findings:  No acute infarct.  Few scattered punctate nonspecific white matter type changes greater on the left.  Similar type findings described in patients with migraine headaches.  Other considerations include that secondary to; small vessel disease, vasculitis, inflammatory process or prior trauma.  Appearance is not typical for result of demyelinating process.  No hydrocephalus.  No  intracranial hemorrhage.  Major intracranial vascular structures are patent.  No intracranial mass lesion detected on this unenhanced exam.  Mild cervical spondylotic changes C3-4 and C4-5 incompletely assessed on the present examination.  Cervical medullary junction, pituitary region, pineal region and orbital structures unremarkable.  IMPRESSION: No acute infarct.  Minimal nonspecific white matter type changes as noted above.  MRA HEAD  Findings: Question 2.2 mm aneurysm M1 segment left middle cerebral artery.  This may represent origin of a vessel.  Anterior circulation without medium or large size vessel significant stenosis or occlusion.  Ectatic vertebral arteries and the basilar artery.  Left vertebral artery slightly dominant in size.  No high-grade stenosis of the vertebral arteries or basilar artery.  Poor delineation of the AICAs.  Mild irregularity of the superior cerebellar artery and distal branches of the posterior cerebral artery bilaterally.  IMPRESSION: Question 2.2 mm left middle cerebral artery M1 segment aneurysm as noted above.   Original Report Authenticated By: Lacy Duverney, M.D.  Microbiology: No results found for this or any previous visit (from the past 240 hour(s)).   Labs: Basic Metabolic Panel:  Recent Labs Lab 05/30/13 1233  NA 138  K 4.0  CL 101  CO2 29  GLUCOSE 98  BUN 6  CREATININE 0.99  CALCIUM 9.6   Liver Function Tests: No results found for this basename: AST, ALT, ALKPHOS, BILITOT, PROT, ALBUMIN,  in the last 168 hours No results found for this basename: LIPASE, AMYLASE,  in the last 168 hours No results found for this basename: AMMONIA,  in the last 168 hours CBC:  Recent Labs Lab 05/30/13 1233  WBC 6.7  NEUTROABS 4.5  HGB 13.5  HCT 38.6  MCV 88.9  PLT 307   Cardiac Enzymes: No results found for this basename: CKTOTAL, CKMB, CKMBINDEX, TROPONINI,  in the last 168 hours BNP: BNP (last 3 results) No results found for this basename:  PROBNP,  in the last 8760 hours CBG:  Recent Labs Lab 05/31/13 0149 05/31/13 0630  GLUCAP 86 87   Results for ROSELY, FERNANDEZ (MRN [id number]) as of 06/01/2013 10:41  Ref. Range 05/30/2013 12:33 05/30/2013 21:20 05/31/2013 02:43 05/31/2013 05:25  Cholesterol Latest Range: 0-200 mg/dL    409 (H)  Triglycerides Latest Range: <150 mg/dL    65  HDL Latest Range: >39 mg/dL    77  LDL (calc) Latest Range: 0-99 mg/dL    811 (H)  VLDL Latest Range: 0-40 mg/dL    13  Total CHOL/HDL Ratio No range found    2.6       Signed:  Venezia Sargeant, J  [facility] 06/01/2013, 10:32 AM

## 2013-07-03 ENCOUNTER — Encounter: Payer: Self-pay | Admitting: Neurology

## 2013-07-03 DIAGNOSIS — G459 Transient cerebral ischemic attack, unspecified: Secondary | ICD-10-CM | POA: Insufficient documentation

## 2013-07-03 DIAGNOSIS — E559 Vitamin D deficiency, unspecified: Secondary | ICD-10-CM | POA: Insufficient documentation

## 2013-07-03 DIAGNOSIS — E782 Mixed hyperlipidemia: Secondary | ICD-10-CM | POA: Insufficient documentation

## 2013-07-04 ENCOUNTER — Ambulatory Visit (INDEPENDENT_AMBULATORY_CARE_PROVIDER_SITE_OTHER): Payer: Managed Care, Other (non HMO) | Admitting: Neurology

## 2013-07-04 ENCOUNTER — Encounter: Payer: Self-pay | Admitting: Neurology

## 2013-07-04 VITALS — BP 118/79 | HR 54 | Ht <= 58 in | Wt 138.0 lb

## 2013-07-04 DIAGNOSIS — E782 Mixed hyperlipidemia: Secondary | ICD-10-CM

## 2013-07-04 DIAGNOSIS — E559 Vitamin D deficiency, unspecified: Secondary | ICD-10-CM

## 2013-07-04 DIAGNOSIS — I671 Cerebral aneurysm, nonruptured: Secondary | ICD-10-CM

## 2013-07-04 DIAGNOSIS — G459 Transient cerebral ischemic attack, unspecified: Secondary | ICD-10-CM

## 2013-07-04 NOTE — Progress Notes (Signed)
[facility]  PATIENT: Judy Hudson DOB: Sep 30, 1961  HISTORICAL  Judy Hudson is a 52 years old right-handed African American female, following her most recent hospital discharge in June 18th 2014.  She went to bed last night at 2300 and felt "normal" and then awoke at 0600 on 05/30/2013 with left facial tingling. As the day progressed she felt heaviness and tingling in her left arm as well. Her upper extremity symptoms only lasted for about an hour but her face continued to tingle. . She called her primary MD at [facility] (Dr Elsie Saas) who saw her at 30 and then directed her to ED. When she arrived at ED,  symptoms have resolved. She was admitted for TIA    CT angiogram of her head showed 2 mm entity arising superiorly from the left MCA M1 segment is re-identified and I favor an infundibulum of the left anterior  temporal artery rather than a small aneurysm.    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   MRI of the brain was normal,  Echocardiogram no cardiac emboli, CT head was normal, carotid Doppler less than 39% stenosis of the right internal carotid artery, left internal carotid artery was patent  Laboratory showed LDL 114, cholesterol 204, normal CBC, A1c 5.6. BMP.she was started on Lipitor.       REVIEW OF SYSTEMS: Full 14 system review of systems performed and notable only for numbness  ALLERGIES: Allergies  Allergen Reactions  . Codeine Nausea And Vomiting  . Ibuprofen Other (See Comments)    "turns her stomach"    HOME MEDICATIONS: Outpatient Prescriptions Prior to Visit  Medication Sig Dispense Refill  . aspirin EC 81 MG tablet Take 1 tablet (81 mg total) by mouth daily.  90 tablet  3  . Multiple Vitamins-Minerals (MULTIVITAMIN PO) Take by mouth daily.      . Vitamin D, Ergocalciferol, (DRISDOL) 50000 UNITS CAPS  Take 50,000 Units by mouth once a week.      Marland Kitchen atorvastatin (LIPITOR) 20 MG tablet Take 1 tablet (20 mg total) by mouth daily.  90 tablet  2   No facility-administered medications prior to visit.    PAST MEDICAL HISTORY: Past Medical History  Diagnosis Date  . Anemia   . Unspecified vitamin D deficiency   . Mixed hyperlipidemia   . Unspecified transient cerebral ischemia     PAST SURGICAL HISTORY: Past Surgical History  Procedure Laterality Date  . None      FAMILY HISTORY: Family History  Problem Relation Age of Onset  . Heart disease    . Cancer - Colon    . Breast cancer    . Diabetes      SOCIAL HISTORY:  History   Social History  . Marital Status: Married    Spouse Name: [name]    Number of Children: 1  . Years of Education: college   Occupational History  .  [employer]   Social History Main Topics  . Smoking status: Never Smoker   . Smokeless tobacco: Never Used  . Alcohol Use: No  . Drug Use: No  . Sexually Active: Not on file   Other Topics Concern  . Not on file   Social History Narrative   Patient lives at home with her husband [name]) and son. Patient works Community education officer.   Right handed.  Caffeine- one cup daily.           PHYSICAL EXAM  Filed Vitals:   07/04/13 0758  BP: 118/79  Pulse: 54  Height: 4\' 10"  (1.473 m)  Weight: 138 lb (62.596 kg)    Not recorded    Body mass index is 28.85 kg/(m^2).   Generalized: In no acute distress  Neck: Supple, no carotid bruits   Cardiac: Regular rate rhythm  Pulmonary: Clear to auscultation bilaterally  Musculoskeletal: No deformity  Neurological examination  Mentation: Alert oriented to time, place, history taking, and causual conversation  Cranial nerve II-XII: Pupils were equal round reactive to light extraocular movements were full, visual field were full on confrontational test. facial sensation and strength were normal. hearing was intact to finger rubbing bilaterally. Uvula tongue  midline.  head turning and shoulder shrug and were normal and symmetric.Tongue protrusion into cheek strength was normal.  Motor: normal tone, bulk and strength.  Sensory: Intact to fine touch, pinprick, preserved vibratory sensation, and proprioception at toes.  Coordination: Normal finger to nose, heel-to-shin bilaterally there was no truncal ataxia  Gait: Rising up from seated position without assistance, normal stance, without trunk ataxia, moderate stride, good arm swing, smooth turning, able to perform tiptoe, and heel walking without difficulty.   Romberg signs: Negative  Deep tendon reflexes: Brachioradialis 2/2, biceps 2/2, triceps 2/2, patellar 2/2, Achilles 2/2, plantar responses were flexor bilaterally.   DIAGNOSTIC DATA (LABS, IMAGING, TESTING) - I reviewed patient records, labs, notes, testing and imaging myself where available.  Lab Results  Component Value Date   WBC 6.7 05/30/2013   HGB 13.5 05/30/2013   HCT 38.6 05/30/2013   MCV 88.9 05/30/2013   PLT 307 05/30/2013      Component Value Date/Time   NA 138 05/30/2013 1233   K 4.0 05/30/2013 1233   CL 101 05/30/2013 1233   CO2 29 05/30/2013 1233   GLUCOSE 98 05/30/2013 1233   BUN 6 05/30/2013 1233   CREATININE 0.99 05/30/2013 1233   CALCIUM 9.6 05/30/2013 1233   GFRNONAA 65* 05/30/2013 1233   GFRAA 76* 05/30/2013 1233   Lab Results  Component Value Date   CHOL 204* 05/31/2013   HDL 77 05/31/2013   LDLCALC 114* 05/31/2013   TRIG 65 05/31/2013   CHOLHDL 2.6 05/31/2013   Lab Results  Component Value Date   HGBA1C 5.6 05/30/2013    ASSESSMENT AND PLAN   52 years old [place] American female, with transient left-sided numbness, TIA, extensive evaluation above, demonstrated left M1 2 mm aneurysm vs. infundibulum off the left anterior temporal artery  1. I will repeat MRA in 12 months. 2. return to clinic in one year 3. she only has mild elevated LDL, it is ok to control it by diet, exercise, stop Lipitor at this point,  daily aspirin, .  .     Levert Feinstein, M.D. Ph.D.  [facility] [address] [place], [place] [address] [phone]

## 2013-07-24 ENCOUNTER — Other Ambulatory Visit (HOSPITAL_COMMUNITY)
Admission: RE | Admit: 2013-07-24 | Discharge: 2013-07-24 | Disposition: A | Discharge status: Home / Self Care | Payer: Managed Care, Other (non HMO) | Source: Ambulatory Visit | Attending: Family Medicine | Admitting: Family Medicine

## 2013-07-24 ENCOUNTER — Other Ambulatory Visit: Payer: Self-pay | Admitting: Family Medicine

## 2013-07-24 DIAGNOSIS — Z0000 Encounter for general adult medical examination without abnormal findings: Secondary | ICD-10-CM | POA: Insufficient documentation

## 2013-11-14 ENCOUNTER — Ambulatory Visit: Payer: Managed Care, Other (non HMO) | Admitting: Cardiology

## 2013-11-26 ENCOUNTER — Encounter: Payer: Self-pay | Admitting: Cardiology

## 2013-11-26 ENCOUNTER — Ambulatory Visit (INDEPENDENT_AMBULATORY_CARE_PROVIDER_SITE_OTHER): Payer: Managed Care, Other (non HMO) | Admitting: Cardiology

## 2013-11-26 VITALS — BP 118/82 | HR 56 | Ht 59.0 in | Wt 131.1 lb

## 2013-11-26 DIAGNOSIS — R079 Chest pain, unspecified: Secondary | ICD-10-CM | POA: Insufficient documentation

## 2013-11-26 DIAGNOSIS — E782 Mixed hyperlipidemia: Secondary | ICD-10-CM

## 2013-11-26 NOTE — Patient Instructions (Signed)
Your physician recommends that you schedule a follow-up appointment in: AS NEEDED  Your physician has requested that you have an exercise tolerance test. For further information please visit [email or web address]. Please also follow instruction sheet, as given.    Exercise Stress Electrocardiography An exercise stress test is a heart test (EKG) which is done while you are moving. You will walk on a treadmill. This test will tell your doctor how your heart does when it is forced to work harder and how much activity you can safely handle. BEFORE THE TEST  Wear shorts or athletic pants.  Wear comfortable tennis shoes.  Women need to wear a bra that allows patches to be put on under it. TEST  An EKG cable will be attached to your waist. This cable is hooked up to patches, which look like round stickers stuck to your chest.  You will be asked to walk on the treadmill.  You will walk until you are too tired or until you are told to stop.  Tell the doctor right away if you have:  Chest pain.  Leg cramps.  Shortness of breath.  Dizziness.  The test may last 30 minutes to 1 hour. The timing depends on your physical condition and the condition of your heart. AFTER THE TEST  You will rest for about 6 minutes. During this time, your heart rhythm and blood pressure will be checked.  The testing equipment will be removed from your body and you can get dressed.  You may go home or back to your hospital room. You may keep doing all your usual activities as told by your doctor. Finding out the results of your test Ask when your test results will be ready. Make sure you get your test results. Document Released: 05/17/2008 Document Revised: 02/21/2012 Document Reviewed: 05/17/2008 Exit[employer] Patient Information 2014 [employer].   

## 2013-11-26 NOTE — Progress Notes (Signed)
     HPI: 52 year-old female for evaluation of chest pain. Echocardiogram in June 2014 showed normal LV function. Carotid Dopplers in June 2014 showed less than 39% stenosis bilaterally. MRA in June of 2014 showed question 2.2 mm left middle cerebral artery aneurysm. Followed by neurology. Patient states that in late November she developed occasional chest pain. It is substernal and described as a pressure. No radiation or associated symptoms. She notices this more after eating. It is not exertional. She denies exertional chest pain, dyspnea on exertion, orthopnea, PND, pedal edema or syncope. Her pain lasts approximately 15-30 minutes and resolves spontaneously.   Current Outpatient Prescriptions  Medication Sig Dispense Refill  . aspirin EC 81 MG tablet Take 1 tablet (81 mg total) by mouth daily.  90 tablet  3  . Multiple Vitamins-Minerals (MULTIVITAMIN PO) Take by mouth daily.      Marland Kitchen omeprazole (PRILOSEC) 40 MG capsule Take 40 mg by mouth daily.      . Vitamin D, Ergocalciferol, (DRISDOL) 50000 UNITS CAPS Take 50,000 Units by mouth once a week.       No current facility-administered medications for this visit.    Allergies  Allergen Reactions  . Codeine Nausea And Vomiting  . Ibuprofen Other (See Comments)    "turns her stomach"    Past Medical History  Diagnosis Date  . Anemia   . Unspecified vitamin D deficiency   . Mixed hyperlipidemia   . Unspecified transient cerebral ischemia     Past Surgical History  Procedure Laterality Date  . None      History   Social History  . Marital Status: Married    Spouse Name: [name]    Number of Children: 1  . Years of Education: college   Occupational History  .  [employer]   Social History Main Topics  . Smoking status: Never Smoker   . Smokeless tobacco: Never Used  . Alcohol Use: No  . Drug Use: No  . Sexual Activity: Not on file   Other Topics Concern  . Not on file   Social History Narrative   Patient lives at home  with her husband [name]) and son. Patient works Community education officer.   Right handed.   Caffeine- one cup daily.          Family History  Problem Relation Age of Onset  . Heart disease Maternal Grandmother   . Cancer - Colon    . Breast cancer    . Diabetes      ROS: no fevers or chills, productive cough, hemoptysis, dysphasia, odynophagia, melena, hematochezia, dysuria, hematuria, rash, seizure activity, orthopnea, PND, pedal edema, claudication. Remaining systems are negative.  Physical Exam:   Blood pressure 118/82, pulse 56, height 4\' 11"  (1.499 m), weight 131 lb 1.9 oz (59.476 kg).  General:  Well developed/well nourished in NAD Skin warm/dry Patient not depressed No peripheral clubbing Back-normal HEENT-normal/normal eyelids Neck supple/normal carotid upstroke bilaterally; no bruits; no JVD; no thyromegaly chest - CTA/ normal expansion CV - RRR/normal S1 and S2; no murmurs, rubs or gallops;  PMI nondisplaced Abdomen -NT/ND, no HSM, no mass, + bowel sounds, no bruit 2+ femoral pulses, no bruits Ext-no edema, chords, 2+ DP Neuro-grossly nonfocal  ECG Sinus rhythm, no ST changes.

## 2013-11-26 NOTE — Assessment & Plan Note (Signed)
Symptoms atypical and may be GI related. Plan exercise treadmill for risk stratification. May need GI evaluation in the future if symptoms persist.

## 2013-11-26 NOTE — Assessment & Plan Note (Signed)
She has initiated a diet. Followup primary care.

## 2013-12-11 ENCOUNTER — Ambulatory Visit (HOSPITAL_COMMUNITY)
Admission: RE | Admit: 2013-12-11 | Discharge: 2013-12-11 | Disposition: A | Discharge status: Home / Self Care | Payer: Managed Care, Other (non HMO) | Source: Ambulatory Visit | Attending: Cardiology | Admitting: Cardiology

## 2013-12-11 DIAGNOSIS — R079 Chest pain, unspecified: Secondary | ICD-10-CM | POA: Insufficient documentation

## 2015-07-25 ENCOUNTER — Emergency Department (HOSPITAL_COMMUNITY)
Admission: EM | Admit: 2015-07-25 | Discharge: 2015-07-25 | Disposition: A | Discharge status: Home / Self Care | Payer: Managed Care, Other (non HMO) | Source: Home / Self Care | Attending: Family Medicine | Admitting: Family Medicine

## 2015-07-25 ENCOUNTER — Encounter (HOSPITAL_COMMUNITY): Payer: Self-pay | Admitting: *Deleted

## 2015-07-25 DIAGNOSIS — R51 Headache: Secondary | ICD-10-CM

## 2015-07-25 DIAGNOSIS — R519 Headache, unspecified: Secondary | ICD-10-CM

## 2015-07-25 MED ORDER — TRAZODONE HCL 100 MG PO TABS: 100.0000 mg | ORAL_TABLET | Freq: Every day | ORAL | Status: DC

## 2015-07-25 NOTE — ED Notes (Signed)
Pt  Reports  sev  Days  Of  Some  dizzyness      Tingling      And  Numbness  Of the  l    Arm    For  sev  Days  -   She  Is  Sitting  Upright  On  The  Exam table  Speaking in  Complete  sentances    And  Is  Alert  And  Oriented     No  Nausea  No  Vomiting

## 2015-07-25 NOTE — Discharge Instructions (Signed)
Use your medicine as needed, see your doctor if further problems.

## 2015-07-25 NOTE — ED Provider Notes (Signed)
CSN: [id number]     Arrival date & time 07/25/15  1724 History   First MD Initiated Contact with Patient 07/25/15 1852     Chief Complaint  Patient presents with  . Dizziness   (Consider location/radiation/quality/duration/timing/severity/associated sxs/prior Treatment) Patient is a 54 y.o. female presenting with dizziness. The history is provided by the patient.  Dizziness Quality:  Lightheadedness Severity:  Mild Onset quality:  Gradual Duration:  1 day Chronicity:  New Context: not with loss of consciousness   Associated symptoms: headaches   Associated symptoms: no nausea, no palpitations, no syncope, no tinnitus, no vision changes and no vomiting     Past Medical History  Diagnosis Date  . Anemia   . Unspecified vitamin D deficiency   . Mixed hyperlipidemia   . Unspecified transient cerebral ischemia    Past Surgical History  Procedure Laterality Date  . None     Family History  Problem Relation Age of Onset  . Heart disease Maternal Grandmother   . Cancer - Colon    . Breast cancer    . Diabetes     Social History  Substance Use Topics  . Smoking status: Never Smoker   . Smokeless tobacco: Never Used  . Alcohol Use: No   OB History    No data available     Review of Systems  Constitutional: Negative.   HENT: Negative for tinnitus.   Cardiovascular: Negative for palpitations and syncope.  Gastrointestinal: Negative for nausea and vomiting.  Genitourinary: Positive for menstrual problem.       Experiencing nightly menopausal sx affecting her sleep signif.  Neurological: Positive for dizziness and headaches.    Allergies  Codeine and Ibuprofen  Home Medications   Prior to Admission medications   Medication Sig Start Date End Date Taking? Authorizing Provider  aspirin EC 81 MG tablet Take 1 tablet (81 mg total) by mouth daily. 06/01/13   Drema Dallas, MD  Multiple Vitamins-Minerals (MULTIVITAMIN PO) Take by mouth daily.    Historical Provider, MD   omeprazole (PRILOSEC) 40 MG capsule Take 40 mg by mouth daily.    Historical Provider, MD  traZODone (DESYREL) 100 MG tablet Take 1 tablet (100 mg total) by mouth at bedtime. 07/25/15   Linna Hoff, MD  Vitamin D, Ergocalciferol, (DRISDOL) 50000 UNITS CAPS Take 50,000 Units by mouth once a week.    Historical Provider, MD   BP 135/91 mmHg  Pulse 61  Temp(Src) 98 F (36.7 C) (Oral)  Resp 61  SpO2 98% Physical Exam  Constitutional: She is oriented to person, place, and time. She appears well-developed and well-nourished. No distress.  HENT:  Right Ear: External ear normal.  Left Ear: External ear normal.  Mouth/Throat: Oropharynx is clear and moist.  Eyes: Conjunctivae and EOM are normal. Pupils are equal, round, and reactive to light.  Neck: Normal range of motion. Neck supple.  Cardiovascular: Regular rhythm, normal heart sounds and intact distal pulses.   Pulmonary/Chest: Effort normal and breath sounds normal.  Lymphadenopathy:    She has no cervical adenopathy.  Neurological: She is alert and oriented to person, place, and time. No cranial nerve deficit. She exhibits normal muscle tone. Coordination normal.  Skin: Skin is warm and dry.  Nursing note and vitals reviewed.   ED Course  Procedures (including critical care time) Labs Review Labs Reviewed - No data to display  Imaging Review No results found.   MDM   1. Headache disorder  Linna Hoff, MD 07/25/15 [phone]

## 2015-08-19 ENCOUNTER — Encounter: Payer: Self-pay | Admitting: Neurology

## 2015-08-19 ENCOUNTER — Ambulatory Visit (INDEPENDENT_AMBULATORY_CARE_PROVIDER_SITE_OTHER): Payer: Managed Care, Other (non HMO) | Admitting: Neurology

## 2015-08-19 VITALS — BP 118/81 | HR 80 | Ht 59.0 in | Wt 148.5 lb

## 2015-08-19 DIAGNOSIS — I671 Cerebral aneurysm, nonruptured: Secondary | ICD-10-CM | POA: Diagnosis not present

## 2015-08-19 NOTE — Progress Notes (Signed)
PATIENT: Judy Hudson DOB: [birth year]  Chief Complaint  Patient presents with  . Transient Ischemic Attack    Patient is here for a f/u. She c/o having a headache about a month ago. She states that the left side of her face felt a little numb around the same. Both the headache and numbness have resolved.      HISTORICAL  [name], seen in refer by    Chief Complaint  Patient presents with  . Transient Ischemic Attack    Patient is here for a f/u. She c/o having a headache about a month ago. She states that the left side of her face felt a little numb around the same. Both the headache and numbness have resolved.    Judy Hudson is a 54 years old right-handed African American female, following her most recent hospital discharge in June 18th 2014. She went to bed last night at 2300 and felt "normal" and then awoke at 0600 on 05/30/2013 with left facial tingling. As the day progressed she felt heaviness and tingling in her left arm as well. Her upper extremity symptoms only lasted for about an hour but her face continued to tingle. . She called her primary MD at [facility] (Dr Elsie Saas) who saw her at 54 and then directed her to ED. When she arrived at ED, symptoms have resolved. 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   MRI of the brain was normal,  Echocardiogram no cardiac emboli, CT head was normal, carotid Doppler less than 39% stenosis of the right internal carotid artery, left internal carotid artery was patent  Laboratory showed LDL 114, cholesterol 204, normal CBC, A1c 5.6. BMP.she was started on Lipitor.    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 Codeine Nausea And Vomiting  . Ibuprofen Other (See Comments)    "turns her stomach"    HOME MEDICATIONS: Current Outpatient Prescriptions  Medication Sig Dispense Refill  . aspirin EC 81 MG tablet Take 1 tablet (81 mg total) by mouth daily. 90 tablet 3  . Multiple Vitamins-Minerals (MULTIVITAMIN PO) Take by mouth daily.    . Vitamin D, Ergocalciferol, (DRISDOL) 50000 UNITS CAPS Take 50,000 Units by mouth once a week.     No current facility-administered medications for this visit.  PAST MEDICAL HISTORY: Past Medical History  Diagnosis Date  . Anemia   . Unspecified vitamin D deficiency   . Mixed hyperlipidemia   . Unspecified transient cerebral ischemia     PAST SURGICAL HISTORY: Past Surgical History  Procedure Laterality Date  . None      FAMILY HISTORY: Family History  Problem Relation Age of Onset  . Heart disease Maternal Grandmother   . Cancer - Colon    . Breast cancer    . Diabetes      SOCIAL HISTORY:  Social History   Social History  . Marital Status: Married     Spouse Name: [name]  . Number of Children: 1  . Years of Education: college   Occupational History  .  [employer]   Social History Main Topics  . Smoking status: Never Smoker   . Smokeless tobacco: Never Used  . Alcohol Use: No  . Drug Use: No  . Sexual Activity: Not on file   Other Topics Concern  . Not on file   Social History Narrative   Patient lives at home with her husband [name]) and son. Patient works Community education officer.   Right handed.   Caffeine- one cup daily.           PHYSICAL EXAM   Filed Vitals:   08/19/15 1638  BP: 118/81  Pulse: 80  Height: 4\' 11"  (1.499 m)  Weight: 148 lb 8 oz (67.359 kg)    Not recorded      Body mass index is 29.98 kg/(m^2).  PHYSICAL EXAMNIATION:  Gen: NAD, conversant, well nourised, obese, well groomed                     Cardiovascular: Regular rate rhythm, no peripheral edema, warm, nontender. Eyes: Conjunctivae clear without exudates or hemorrhage Neck: Supple, no carotid bruise. Pulmonary: Clear to auscultation bilaterally   NEUROLOGICAL EXAM:  MENTAL STATUS: Speech:    Speech is normal; fluent and spontaneous with normal comprehension.  Cognition:     Orientation to time, place and person     Normal recent and remote memory     Normal Attention span and concentration     Normal Language, naming, repeating,spontaneous speech     Fund of knowledge   CRANIAL NERVES: CN II: Visual fields are full to confrontation. Fundoscopic exam is normal with sharp discs and no vascular changes. Pupils are round equal and briskly reactive to light. CN III, IV, VI: extraocular movement are normal. No ptosis. CN V: Facial sensation is intact to pinprick in all 3 divisions bilaterally. Corneal responses are intact.  CN VII: Face is symmetric with normal eye closure and smile. CN VIII: Hearing is normal to rubbing fingers CN IX, X: Palate elevates symmetrically. Phonation is normal. CN XI: Head turning and shoulder shrug are  intact CN XII: Tongue is midline with normal movements and no atrophy.  MOTOR: There is no pronator drift of out-stretched arms. Muscle bulk and tone are normal. Muscle strength is normal.  REFLEXES: Reflexes are 2+ and symmetric at the biceps, triceps, knees, and ankles. Plantar responses are flexor.  SENSORY: Intact to light touch, pinprick, position sense, and vibration sense are intact in fingers and toes.  COORDINATION: Rapid alternating movements and fine finger movements are intact. There is no dysmetria on finger-to-nose and heel-knee-shin.    GAIT/STANCE: Posture is normal. Gait is steady with normal steps, base, arm swing, and turning. Heel and toe walking are normal. Tandem gait is  normal.  Romberg is absent.   DIAGNOSTIC DATA (LABS, IMAGING, TESTING) - I reviewed patient records, labs, notes, testing and imaging myself where available.   ASSESSMENT AND PLAN  Arielis Leonhart is a 54 y.o. female   Headaches Possible aneurysm History of TIA  Repeat MRA of the brain I will call her report,,   Levert Feinstein, M.D. Ph.D.  [facility] [address] [place], [place] [address] Ph: [phone] Fax: [phone]  CC: Referring Provider

## 2015-09-10 ENCOUNTER — Ambulatory Visit (INDEPENDENT_AMBULATORY_CARE_PROVIDER_SITE_OTHER): Payer: Managed Care, Other (non HMO)

## 2015-09-10 DIAGNOSIS — I671 Cerebral aneurysm, nonruptured: Secondary | ICD-10-CM | POA: Diagnosis not present

## 2015-09-15 ENCOUNTER — Telehealth: Payer: Self-pay | Admitting: Neurology

## 2015-09-15 NOTE — Telephone Encounter (Signed)
Spoke to patient and she is aware of the results below.

## 2015-09-15 NOTE — Telephone Encounter (Signed)
Please call patient, MRI of the brain showed no change compared to previous study in June 2014, mild 2-3 mm irregular dilatation of the left middle cerebral artery  Unremarkable MRA of the brain showing no significant stenosis of large and medium size intracranial vessels. 2-3 mm irregular dilatation of the left middle cerebral artery at the origin of the anterior temporal branch may represent small infundibulum or aneurysm. Overall no significant change compared with MRA of the brain dated 05/31/13

## 2016-11-15 ENCOUNTER — Other Ambulatory Visit: Payer: Self-pay | Admitting: Family Medicine

## 2016-11-15 ENCOUNTER — Other Ambulatory Visit (HOSPITAL_COMMUNITY)
Admission: RE | Admit: 2016-11-15 | Discharge: 2016-11-15 | Disposition: A | Discharge status: Home / Self Care | Payer: Managed Care, Other (non HMO) | Source: Ambulatory Visit | Attending: Family Medicine | Admitting: Family Medicine

## 2016-11-15 DIAGNOSIS — Z124 Encounter for screening for malignant neoplasm of cervix: Secondary | ICD-10-CM | POA: Diagnosis not present

## 2016-11-16 LAB — CYTOLOGY - PAP: DIAGNOSIS: NEGATIVE

## 2019-01-12 ENCOUNTER — Other Ambulatory Visit: Payer: Self-pay | Admitting: Family Medicine

## 2019-01-12 ENCOUNTER — Other Ambulatory Visit (HOSPITAL_COMMUNITY)
Admission: RE | Admit: 2019-01-12 | Discharge: 2019-01-12 | Disposition: A | Discharge status: Home / Self Care | Payer: 59 | Source: Ambulatory Visit | Attending: Family Medicine | Admitting: Family Medicine

## 2019-01-12 DIAGNOSIS — Z124 Encounter for screening for malignant neoplasm of cervix: Secondary | ICD-10-CM | POA: Insufficient documentation

## 2019-01-17 LAB — CYTOLOGY - PAP
Diagnosis: NEGATIVE
HPV: NOT DETECTED

## 2020-01-18 ENCOUNTER — Other Ambulatory Visit: Payer: Self-pay | Admitting: Family Medicine

## 2020-01-18 DIAGNOSIS — Z1231 Encounter for screening mammogram for malignant neoplasm of breast: Secondary | ICD-10-CM

## 2020-01-21 ENCOUNTER — Ambulatory Visit: Payer: 59 | Attending: Internal Medicine

## 2020-01-21 DIAGNOSIS — Z23 Encounter for immunization: Secondary | ICD-10-CM | POA: Insufficient documentation

## 2020-01-21 NOTE — Progress Notes (Signed)
   Covid-19 Vaccination Clinic  Name:  Mikeria Valin    MRN: [id number] DOB: January 27, 1961  01/21/2020  Ms. Bowlds was observed post Covid-19 immunization for 15 minutes without incidence. She was provided with Vaccine Information Sheet and instruction to access the V-Safe system.   Ms. Langland was instructed to call 911 with any severe reactions post vaccine: Marland Kitchen Difficulty breathing  . Swelling of your face and throat  . A fast heartbeat  . A bad rash all over your body  . Dizziness and weakness    Immunizations Administered    Name Date Dose VIS Date Route   Pfizer COVID-19 Vaccine 01/21/2020 11:10 AM 0.3 mL 11/23/2019 Intramuscular   Manufacturer: [employer], [employer]   Lot: [id number]   NDC: [address]-4

## 2020-02-15 ENCOUNTER — Ambulatory Visit: Payer: 59 | Attending: Internal Medicine

## 2020-02-15 DIAGNOSIS — Z23 Encounter for immunization: Secondary | ICD-10-CM | POA: Insufficient documentation

## 2020-02-15 NOTE — Progress Notes (Signed)
   Covid-19 Vaccination Clinic  Name:  Judy Hudson    MRN: [id number] DOB: 10/17/1961  02/15/2020  Ms. [name] was observed post Covid-19 immunization for 15 minutes without incident. She was provided with Vaccine Information Sheet and instruction to access the V-Safe system.   Ms. [name] was instructed to call 911 with any severe reactions post vaccine: Marland Kitchen Difficulty breathing  . Swelling of face and throat  . A fast heartbeat  . A bad rash all over body  . Dizziness and weakness   Immunizations Administered    Name Date Dose VIS Date Route   Pfizer COVID-19 Vaccine 02/15/2020  8:10 AM 0.3 mL 11/23/2019 Intramuscular   Manufacturer: [employer], [employer]   Lot: [id number]   NDC: [address]-6

## 2020-02-18 ENCOUNTER — Ambulatory Visit: Payer: 59

## 2020-05-21 ENCOUNTER — Other Ambulatory Visit: Payer: Self-pay | Admitting: Family Medicine

## 2020-05-21 DIAGNOSIS — R9089 Other abnormal findings on diagnostic imaging of central nervous system: Secondary | ICD-10-CM

## 2020-06-23 ENCOUNTER — Ambulatory Visit
Admission: RE | Admit: 2020-06-23 | Discharge: 2020-06-23 | Disposition: A | Discharge status: Home / Self Care | Payer: 59 | Source: Ambulatory Visit | Attending: Family Medicine | Admitting: Family Medicine

## 2020-06-23 ENCOUNTER — Other Ambulatory Visit: Payer: Self-pay

## 2020-06-23 DIAGNOSIS — R9089 Other abnormal findings on diagnostic imaging of central nervous system: Secondary | ICD-10-CM

## 2022-06-02 IMAGING — MR MR MRA HEAD W/O CM
1 series · 23 of 48 positions shown · non-contrast
Comparison: CT and MR angiography 05/31/2013.

CLINICAL DATA: Question aneurysm versus infundibulum at the time CT
and MR studies 05/31/2013.

EXAM:
MRA HEAD WITHOUT CONTRAST
TECHNIQUE: Angiographic images of the Circle of Willis were obtained using MRA
technique without intravenous contrast.

[Series 3: tof_[id number]_multi-slab · axial · 0.7mm · 0.35mm/px · z∈[-38,+56]mm · 23 of 143 slices shown]
[im 1/143]
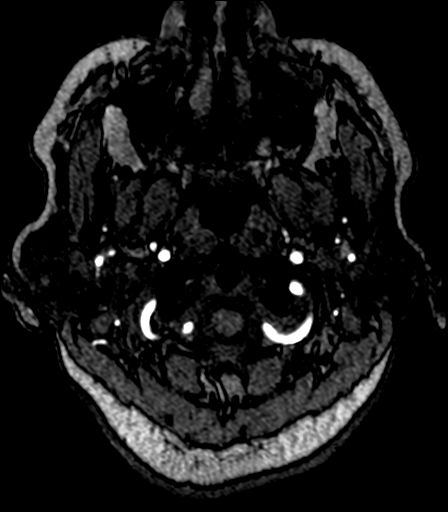
[im 4/143]
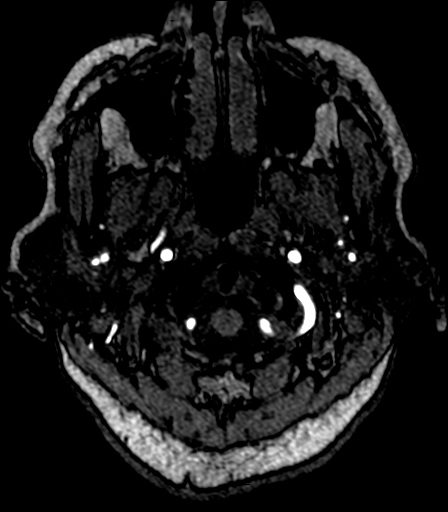
[im 7/143]
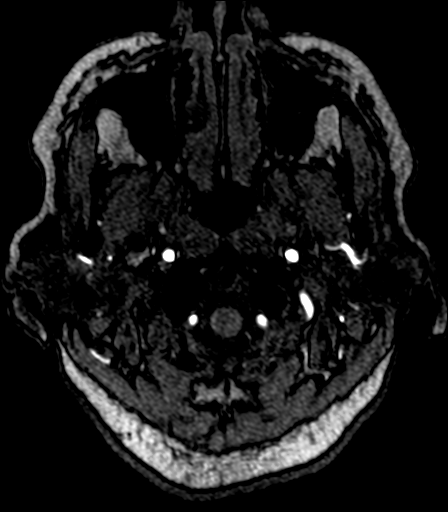
[im 10/143]
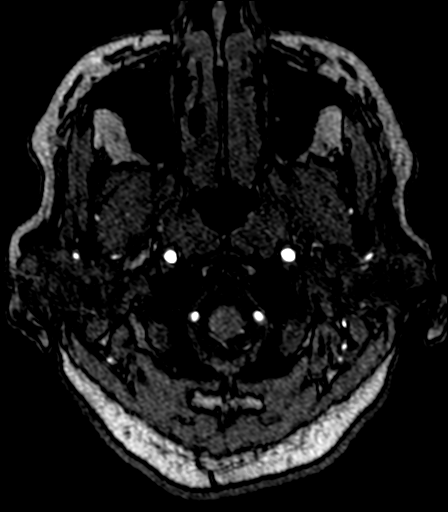
[im 13/143]
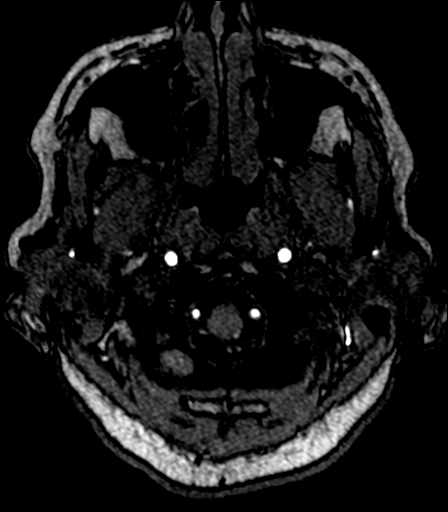
[im 16/143]
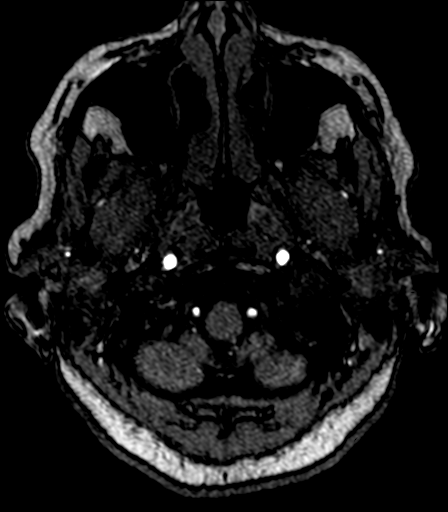
[im 19/143]
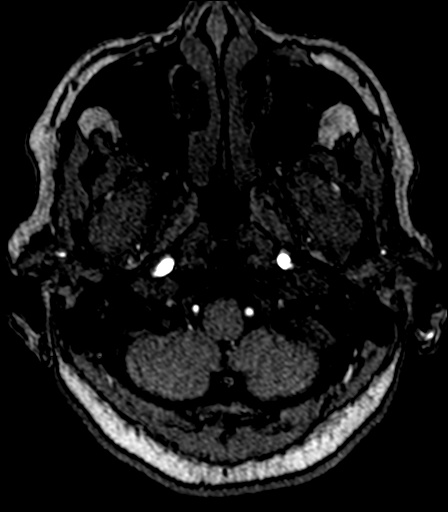
[im 22/143]
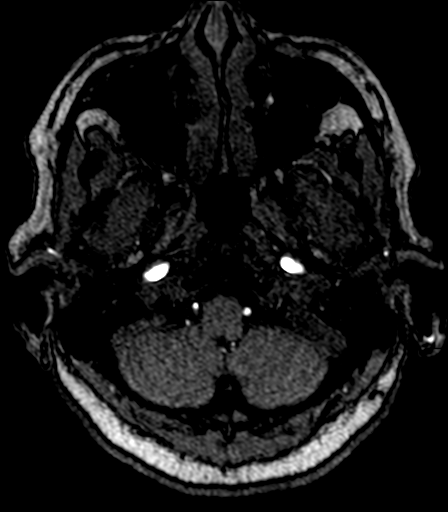
[im 25/143]
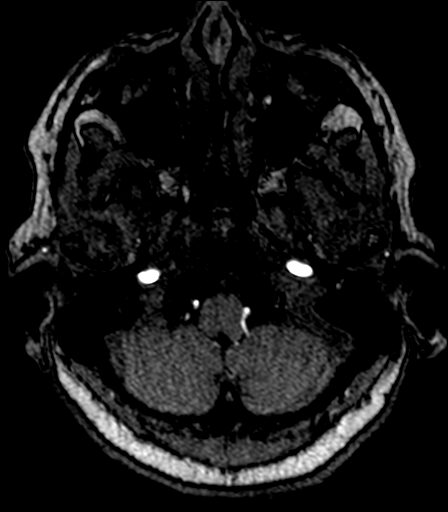
[im 28/143]
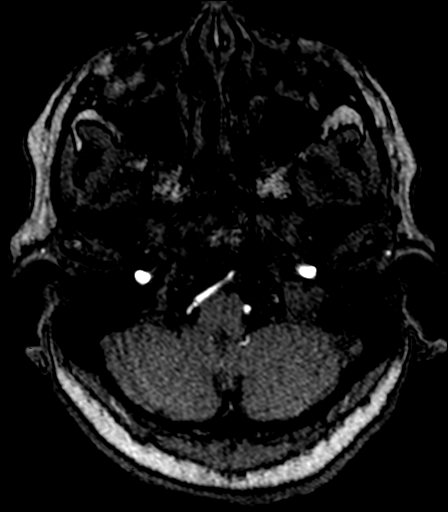
[im 31/143]
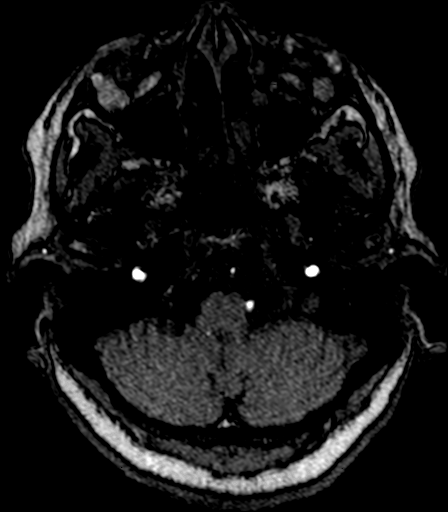
[im 34/143]
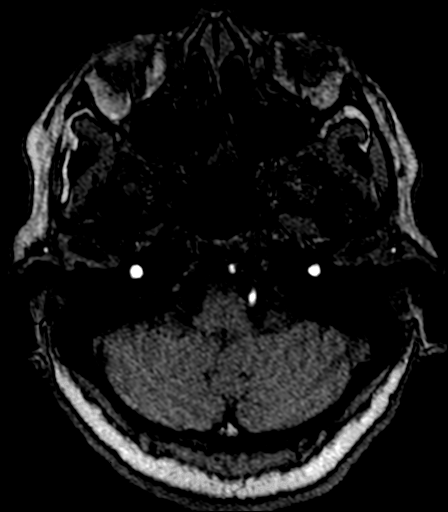
[im 37/143]
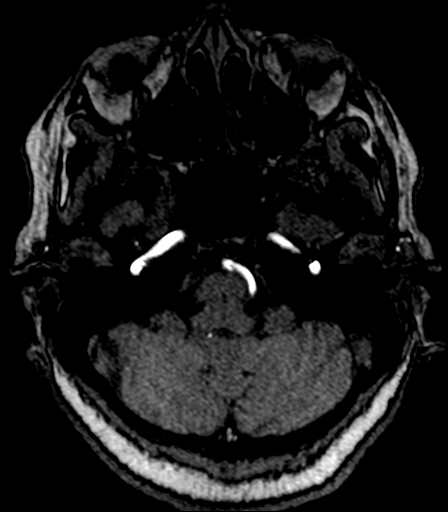
[im 40/143]
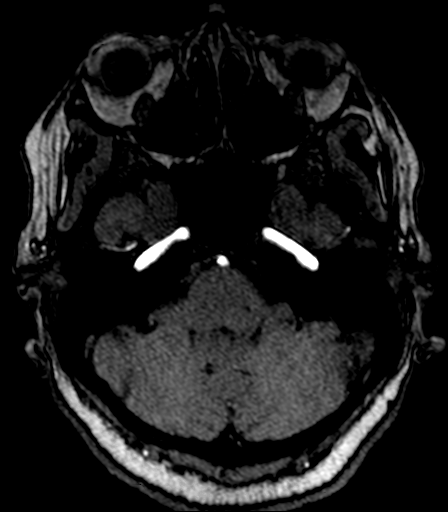
[im 43/143]
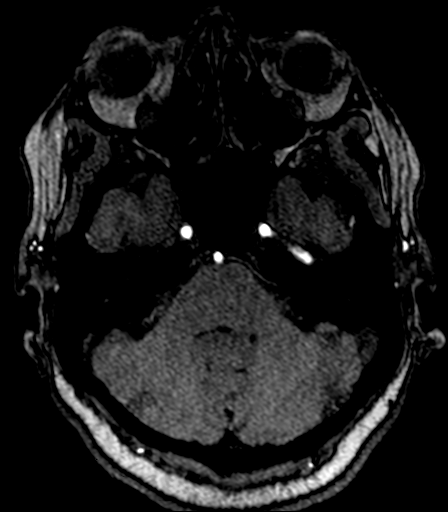
[im 46/143]
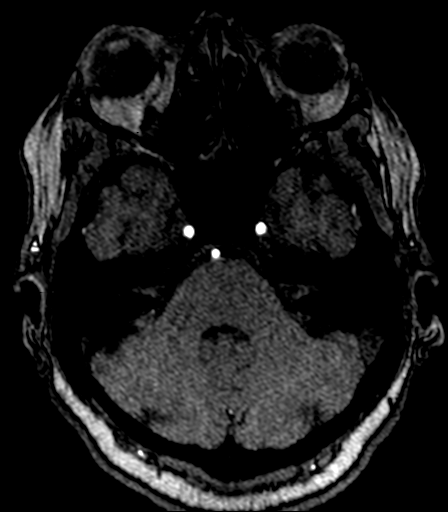
[im 64/143]
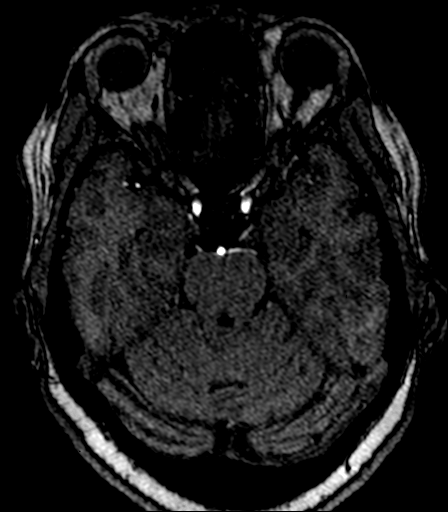
[im 73/143]
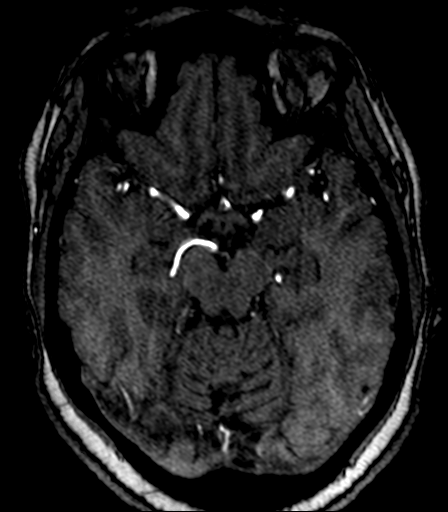
[im 82/143]
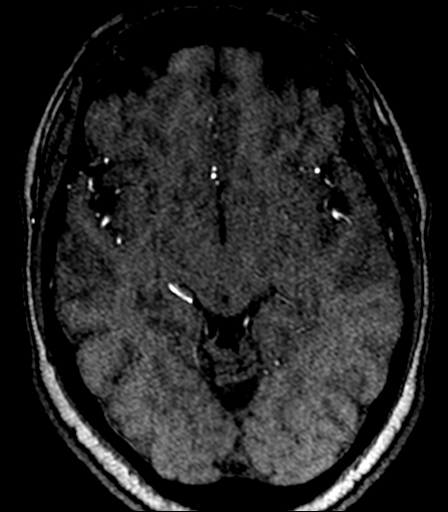
[im 100/143]
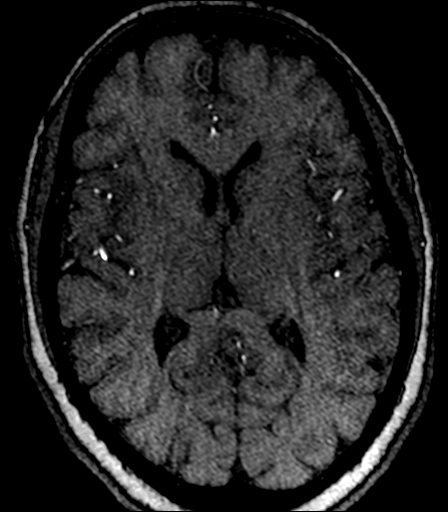
[im 118/143]
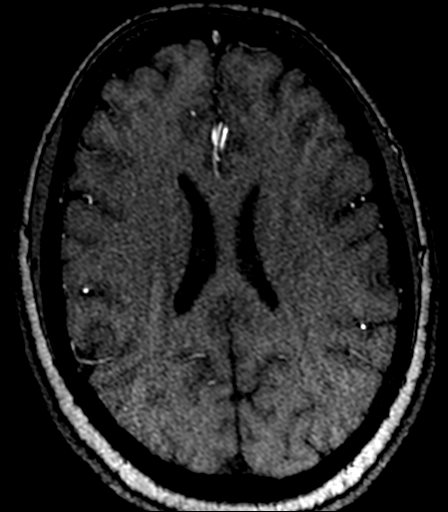
[im 121/143]
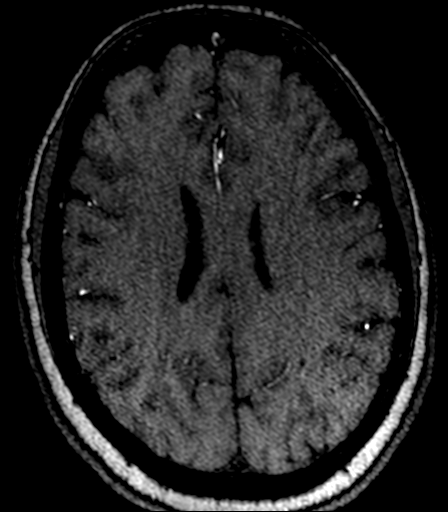
[im 136/143]
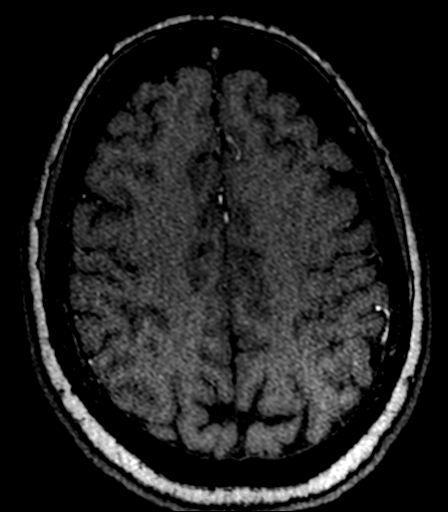

[23 of 48 positions shown; findings below may reference images not displayed]

FINDINGS: Both internal carotid arteries widely patent through the skull base
and siphon regions. The anterior and middle cerebral vessels are
patent. No change in the appearance of a 2 mm aneurysm versus
infundibulum of the left middle cerebral artery projecting
superiorly. This was previously favored to represent an
infundibulum. Even if it does represent an aneurysm, it is not
showing any sign of enlargement.

Both vertebral arteries widely patent to the basilar. No basilar
stenosis. Posterior circulation branch vessels are normal.
IMPRESSION: Stable MR angiography. No change in a 2 mm aneurysm or infundibulum
arising from the left middle cerebral artery.
